# Patient Record
Sex: Female | Born: 1945 | Race: White | Hispanic: No | Marital: Married | Smoking: Never smoker
Health system: Southern US, Community
[De-identification: ages and names within clinical notes are randomized; demographics above are authoritative.]

## PROBLEM LIST (undated history)

## (undated) DIAGNOSIS — E785 Hyperlipidemia, unspecified: Secondary | ICD-10-CM

## (undated) DIAGNOSIS — I4891 Unspecified atrial fibrillation: Secondary | ICD-10-CM

## (undated) DIAGNOSIS — I10 Essential (primary) hypertension: Secondary | ICD-10-CM

## (undated) DIAGNOSIS — R42 Dizziness and giddiness: Secondary | ICD-10-CM

## (undated) HISTORY — DX: Dizziness and giddiness: R42

## (undated) HISTORY — PX: JOINT REPLACEMENT: SHX530

## (undated) HISTORY — PX: BUNIONECTOMY: SHX129

---

## 2000-06-07 ENCOUNTER — Other Ambulatory Visit: Admission: RE | Admit: 2000-06-07 | Discharge: 2000-06-07 | Payer: Self-pay | Admitting: Obstetrics and Gynecology

## 2000-06-12 ENCOUNTER — Encounter: Payer: Self-pay | Admitting: Obstetrics and Gynecology

## 2000-06-12 ENCOUNTER — Encounter: Admission: RE | Admit: 2000-06-12 | Discharge: 2000-06-12 | Payer: Self-pay | Admitting: Obstetrics and Gynecology

## 2001-06-21 ENCOUNTER — Encounter: Payer: Self-pay | Admitting: Obstetrics and Gynecology

## 2001-06-21 ENCOUNTER — Encounter: Admission: RE | Admit: 2001-06-21 | Discharge: 2001-06-21 | Payer: Self-pay | Admitting: Obstetrics and Gynecology

## 2002-07-29 ENCOUNTER — Encounter: Admission: RE | Admit: 2002-07-29 | Discharge: 2002-07-29 | Payer: Self-pay | Admitting: Obstetrics and Gynecology

## 2002-07-29 ENCOUNTER — Encounter: Payer: Self-pay | Admitting: Obstetrics and Gynecology

## 2002-08-01 ENCOUNTER — Encounter: Payer: Self-pay | Admitting: Obstetrics and Gynecology

## 2002-08-01 ENCOUNTER — Encounter: Admission: RE | Admit: 2002-08-01 | Discharge: 2002-08-01 | Payer: Self-pay | Admitting: Internal Medicine

## 2003-08-14 ENCOUNTER — Encounter: Admission: RE | Admit: 2003-08-14 | Discharge: 2003-08-14 | Payer: Self-pay | Admitting: Internal Medicine

## 2003-08-27 ENCOUNTER — Encounter: Admission: RE | Admit: 2003-08-27 | Discharge: 2003-08-27 | Payer: Self-pay | Admitting: Obstetrics and Gynecology

## 2005-08-07 ENCOUNTER — Emergency Department (HOSPITAL_COMMUNITY): Admission: EM | Admit: 2005-08-07 | Discharge: 2005-08-07 | Payer: Self-pay | Admitting: Emergency Medicine

## 2010-10-16 ENCOUNTER — Encounter: Payer: Self-pay | Admitting: Obstetrics and Gynecology

## 2011-11-13 ENCOUNTER — Emergency Department (HOSPITAL_COMMUNITY)
Admission: EM | Admit: 2011-11-13 | Discharge: 2011-11-13 | Disposition: A | Discharge status: Home / Self Care | Payer: Medicare Other | Attending: Emergency Medicine | Admitting: Emergency Medicine

## 2011-11-13 ENCOUNTER — Encounter (HOSPITAL_COMMUNITY): Payer: Self-pay | Admitting: Emergency Medicine

## 2011-11-13 DIAGNOSIS — R3915 Urgency of urination: Secondary | ICD-10-CM | POA: Insufficient documentation

## 2011-11-13 DIAGNOSIS — N390 Urinary tract infection, site not specified: Secondary | ICD-10-CM

## 2011-11-13 DIAGNOSIS — R350 Frequency of micturition: Secondary | ICD-10-CM | POA: Insufficient documentation

## 2011-11-13 DIAGNOSIS — R300 Dysuria: Secondary | ICD-10-CM | POA: Insufficient documentation

## 2011-11-13 LAB — URINALYSIS, ROUTINE W REFLEX MICROSCOPIC
Bilirubin Urine: NEGATIVE
Ketones, ur: NEGATIVE mg/dL
Protein, ur: NEGATIVE mg/dL
Urobilinogen, UA: 0.2 mg/dL (ref 0.0–1.0)

## 2011-11-13 LAB — URINE MICROSCOPIC-ADD ON

## 2011-11-13 MED ORDER — PHENAZOPYRIDINE HCL 200 MG PO TABS: 200.0000 mg | ORAL_TABLET | Freq: Three times a day (TID) | ORAL | Status: AC

## 2011-11-13 MED ORDER — CEFPODOXIME PROXETIL 200 MG PO TABS: 200.0000 mg | ORAL_TABLET | Freq: Two times a day (BID) | ORAL | Status: AC

## 2011-11-13 MED ORDER — CEFTRIAXONE SODIUM 1 G IJ SOLR
1.0000 g | Freq: Once | INTRAMUSCULAR | Status: AC
Start: 2011-11-13 — End: 2011-11-13
  Administered 2011-11-13: 1 g via INTRAMUSCULAR
  Filled 2011-11-13: qty 10

## 2011-11-13 NOTE — ED Provider Notes (Signed)
History     CSN: [id number]  Arrival date & time 11/13/11  0248   First MD Initiated Contact with Patient 11/13/11 0335      Chief Complaint  Patient presents with  . Urinary Tract Infection    (Consider location/radiation/quality/duration/timing/severity/associated sxs/prior treatment) HPI Dysuria with urgency and frequency tonight. Patient has had a urinary tract infection in the past and this feels similar. She denies any fever chills. She denies any nausea vomiting or diarrhea. No abdominal pain or discomfort. Burning only with urination. No radiation of discomfort. Moderate in severity. Intermittent symptoms. No back pain. No vaginal bleeding or discharge.   History reviewed. No pertinent past medical history.  History reviewed. No pertinent past surgical history.  History reviewed. No pertinent family history.  History  Substance Use Topics  . Smoking status: Never Smoker   . Smokeless tobacco: Not on file  . Alcohol Use: No    OB History    Grav Para Term Preterm Abortions TAB SAB Ect Mult Living                  Review of Systems  Constitutional: Negative for fever and chills.  HENT: Negative for neck pain and neck stiffness.   Eyes: Negative for pain.  Respiratory: Negative for shortness of breath.   Cardiovascular: Negative for chest pain.  Gastrointestinal: Negative for abdominal pain.  Genitourinary: Positive for dysuria, urgency and frequency.  Musculoskeletal: Negative for back pain.  Skin: Negative for rash.  Neurological: Negative for headaches.  All other systems reviewed and are negative.    Allergies  Review of patient's allergies indicates no known allergies.  Home Medications   Current Outpatient Rx  Name Route Sig Dispense Refill  . OMEGA-3 FATTY ACIDS 1000 MG PO CAPS Oral Take 1 g by mouth daily.      BP 152/75  Pulse 86  Temp(Src) 97.4 F (36.3 C) (Oral)  Resp 18  SpO2 97%  Physical Exam  Constitutional: She is oriented to  person, place, and time. She appears well-developed and well-nourished.  HENT:  Head: Normocephalic and atraumatic.  Eyes: Conjunctivae and EOM are normal. Pupils are equal, round, and reactive to light.  Neck: Trachea normal. Neck supple. No thyromegaly present.  Cardiovascular: Normal rate, regular rhythm, S1 normal, S2 normal and normal pulses.     No systolic murmur is present   No diastolic murmur is present  Pulses:      Radial pulses are 2+ on the right side, and 2+ on the left side.  Pulmonary/Chest: Effort normal and breath sounds normal. She has no wheezes. She has no rhonchi. She has no rales. She exhibits no tenderness.  Abdominal: Soft. Normal appearance and bowel sounds are normal. There is no tenderness. There is no rebound, no guarding, no CVA tenderness and negative Murphy's sign.  Musculoskeletal:       BLE:s Calves nontender, no cords or erythema, negative Homans sign  Neurological: She is alert and oriented to person, place, and time. She has normal strength. No cranial nerve deficit or sensory deficit. GCS eye subscore is 4. GCS verbal subscore is 5. GCS motor subscore is 6.  Skin: Skin is warm and dry. No rash noted. She is not diaphoretic.  Psychiatric: Her speech is normal.       Cooperative and appropriate    ED Course  Procedures (including critical care time)  Labs Reviewed  URINALYSIS, ROUTINE W REFLEX MICROSCOPIC - Abnormal; Notable for the following:    Color,  Urine STRAW (*)    Specific Gravity, Urine 1.002 (*)    Hgb urine dipstick MODERATE (*)    Leukocytes, UA LARGE (*)    All other components within normal limits  URINE MICROSCOPIC-ADD ON  URINE CULTURE    Intramuscular Rocephin. Urine culture sent   MDM   Clinical UTI verified by UA as above. Culture pending. Prescription for Vantin provided. Reliable historian verbalizes UTI precautions. No clinical pyelo. Stable for outpatient management and d/c home.         Sunnie Nielsen,  MD 11/13/11 [phone]

## 2011-11-13 NOTE — ED Notes (Signed)
Patient complaining of burning during urination and decreased urinary output that started tonight.  Patient denies past history of UTIs.

## 2011-11-14 LAB — URINE CULTURE
Colony Count: 15000
Culture  Setup Time: 201302171110

## 2012-02-26 ENCOUNTER — Encounter (HOSPITAL_COMMUNITY): Payer: Self-pay | Admitting: *Deleted

## 2012-02-26 ENCOUNTER — Emergency Department (HOSPITAL_COMMUNITY)
Admission: EM | Admit: 2012-02-26 | Discharge: 2012-02-26 | Disposition: A | Discharge status: Home / Self Care | Payer: Medicare Other | Attending: Emergency Medicine | Admitting: Emergency Medicine

## 2012-02-26 DIAGNOSIS — N390 Urinary tract infection, site not specified: Secondary | ICD-10-CM

## 2012-02-26 LAB — URINALYSIS, ROUTINE W REFLEX MICROSCOPIC
Bilirubin Urine: NEGATIVE
Specific Gravity, Urine: 1.004 — ABNORMAL LOW (ref 1.005–1.030)
Urobilinogen, UA: 0.2 mg/dL (ref 0.0–1.0)

## 2012-02-26 LAB — URINE MICROSCOPIC-ADD ON

## 2012-02-26 MED ORDER — PHENAZOPYRIDINE HCL 200 MG PO TABS: 200.0000 mg | ORAL_TABLET | Freq: Three times a day (TID) | ORAL | Status: AC

## 2012-02-26 MED ORDER — SULFAMETHOXAZOLE-TRIMETHOPRIM 800-160 MG PO TABS: 1.0000 | ORAL_TABLET | Freq: Two times a day (BID) | ORAL | Status: AC

## 2012-02-26 NOTE — Discharge Instructions (Signed)
Your tests show that you have a very slight urinary infection - take the medicines as prescribed, see your doctor in 2 days if still having symptoms.  Return to the hospital for severe or worsening pain, nausea, fevers

## 2012-02-26 NOTE — ED Notes (Signed)
Pt c/o pain with urination that started yesterday.

## 2012-02-26 NOTE — ED Provider Notes (Signed)
History     CSN: [id number]  Arrival date & time 02/26/12  0447   First MD Initiated Contact with Patient 02/26/12 [phone]      Chief Complaint  Patient presents with  . Urinary Retention    (Consider location/radiation/quality/duration/timing/severity/associated sxs/prior treatment) HPI Comments: 66 year old female with a history of no significant past medical problems other than a urinary infection 2 month ago.  She states that starting yesterday she started having some burning when she urinated, no pain in the abdomen, no flank pain, no fevers chills nausea or vomiting. The symptoms are intermittent, nothing makes this better or worse  The history is provided by the patient.    History reviewed. No pertinent past medical history.  History reviewed. No pertinent past surgical history.  No family history on file.  History  Substance Use Topics  . Smoking status: Never Smoker   . Smokeless tobacco: Not on file  . Alcohol Use: No    OB History    Grav Para Term Preterm Abortions TAB SAB Ect Mult Living                  Review of Systems  Constitutional: Negative for fever and chills.  Gastrointestinal: Negative for nausea, vomiting and abdominal pain.  Genitourinary: Positive for dysuria. Negative for frequency, hematuria, flank pain and difficulty urinating.  Musculoskeletal: Negative for back pain.    Allergies  Review of patient's allergies indicates no known allergies.  Home Medications   Current Outpatient Rx  Name Route Sig Dispense Refill  . OMEGA-3 FATTY ACIDS 1000 MG PO CAPS Oral Take 1 g by mouth daily.    Marland Kitchen VITAMIN D (CHOLECALCIFEROL) 400 UNITS PO CHEW Oral Chew 1 tablet by mouth daily.    Marland Kitchen PHENAZOPYRIDINE HCL 200 MG PO TABS Oral Take 1 tablet (200 mg total) by mouth 3 (three) times daily. 6 tablet 0  . SULFAMETHOXAZOLE-TRIMETHOPRIM 800-160 MG PO TABS Oral Take 1 tablet by mouth every 12 (twelve) hours. 14 tablet 0    BP 161/91  Pulse 74  Temp 97.9  F (36.6 C)  Resp 16  SpO2 99%  Physical Exam  Nursing note and vitals reviewed. Constitutional: She appears well-developed and well-nourished. No distress.  HENT:  Head: Normocephalic and atraumatic.  Mouth/Throat: Oropharynx is clear and moist. No oropharyngeal exudate.  Eyes: Conjunctivae are normal. No scleral icterus.  Neck: Normal range of motion. Neck supple. No thyromegaly present.  Cardiovascular: Normal rate, regular rhythm, normal heart sounds and intact distal pulses.   Pulmonary/Chest: Effort normal and breath sounds normal. No respiratory distress.  Abdominal: Soft. She exhibits no distension. There is no tenderness. There is no rebound and no guarding.  Musculoskeletal: Normal range of motion. She exhibits no edema and no tenderness.  Neurological: She is alert. Coordination normal.  Skin: Skin is warm and dry. No rash noted. No erythema.    ED Course  Procedures (including critical care time)  Labs Reviewed  URINALYSIS, ROUTINE W REFLEX MICROSCOPIC - Abnormal; Notable for the following:    Specific Gravity, Urine 1.004 (*)    Hgb urine dipstick LARGE (*)    Leukocytes, UA LARGE (*)    All other components within normal limits  URINE MICROSCOPIC-ADD ON - Abnormal; Notable for the following:    Bacteria, UA FEW (*) RARE   All other components within normal limits  URINE CULTURE   No results found.   1. UTI (urinary tract infection)       MDM  Urinalysis shows borderline infection, culture ordered, Bactrim given for prescription, benign appearance otherwise, no urinary retention.  Discharge Prescriptions include:  Bactrim        Vida Roller, MD 02/26/12 [phone]

## 2012-02-27 LAB — URINE CULTURE: Culture  Setup Time: 201306021129

## 2012-10-17 ENCOUNTER — Emergency Department (HOSPITAL_COMMUNITY)
Admission: EM | Admit: 2012-10-17 | Discharge: 2012-10-17 | Disposition: A | Discharge status: Home / Self Care | Payer: Medicare Other | Attending: Emergency Medicine | Admitting: Emergency Medicine

## 2012-10-17 ENCOUNTER — Emergency Department (HOSPITAL_COMMUNITY): Payer: Medicare Other

## 2012-10-17 ENCOUNTER — Encounter (HOSPITAL_COMMUNITY): Payer: Self-pay | Admitting: Emergency Medicine

## 2012-10-17 DIAGNOSIS — Z79899 Other long term (current) drug therapy: Secondary | ICD-10-CM | POA: Insufficient documentation

## 2012-10-17 DIAGNOSIS — I10 Essential (primary) hypertension: Secondary | ICD-10-CM | POA: Insufficient documentation

## 2012-10-17 DIAGNOSIS — IMO0001 Reserved for inherently not codable concepts without codable children: Secondary | ICD-10-CM | POA: Insufficient documentation

## 2012-10-17 DIAGNOSIS — R5381 Other malaise: Secondary | ICD-10-CM | POA: Insufficient documentation

## 2012-10-17 DIAGNOSIS — R42 Dizziness and giddiness: Secondary | ICD-10-CM | POA: Insufficient documentation

## 2012-10-17 DIAGNOSIS — R5383 Other fatigue: Secondary | ICD-10-CM | POA: Insufficient documentation

## 2012-10-17 LAB — URINALYSIS, ROUTINE W REFLEX MICROSCOPIC
Bilirubin Urine: NEGATIVE
Ketones, ur: NEGATIVE mg/dL
Nitrite: NEGATIVE
Urobilinogen, UA: 0.2 mg/dL (ref 0.0–1.0)

## 2012-10-17 LAB — CBC WITH DIFFERENTIAL/PLATELET
Basophils Absolute: 0 10*3/uL (ref 0.0–0.1)
Basophils Relative: 0 % (ref 0–1)
Eosinophils Absolute: 0.1 10*3/uL (ref 0.0–0.7)
Eosinophils Relative: 1 % (ref 0–5)
HCT: 38.4 % (ref 36.0–46.0)
MCHC: 34.1 g/dL (ref 30.0–36.0)
MCV: 85.5 fL (ref 78.0–100.0)
Monocytes Absolute: 0.3 10*3/uL (ref 0.1–1.0)
RDW: 12.7 % (ref 11.5–15.5)

## 2012-10-17 LAB — BASIC METABOLIC PANEL
Calcium: 9.5 mg/dL (ref 8.4–10.5)
Creatinine, Ser: 0.52 mg/dL (ref 0.50–1.10)
GFR calc Af Amer: >90 mL/min (ref >90)

## 2012-10-17 LAB — TROPONIN I: Troponin I: <0.3 ng/mL (ref <0.30)

## 2012-10-17 MED ORDER — HYDROCHLOROTHIAZIDE 25 MG PO TABS: 25.0000 mg | ORAL_TABLET | Freq: Every day | ORAL | Status: DC

## 2012-10-17 NOTE — ED Provider Notes (Signed)
History     CSN: [id number]  Arrival date & time 10/17/12  0806   First MD Initiated Contact with Patient 10/17/12 [phone]      Chief Complaint  Patient presents with  . Dizziness  . Fatigue    (Consider location/radiation/quality/duration/timing/severity/associated sxs/prior treatment) The history is provided by the patient.   patient states that she feels dizzy and weak. She states that she was lightheaded and started sweating. She states her blood pressure was 180s systolic. She states she's on no blood pressure medications. No chest pain. She describes as lightheadedness not as the room spinning. No headache. No confusion. She states she's been doing well the last 2 days. She states that she works 9 to 6:30 every day. She does not smoke. Does not see Dr. She has no known cardiac problems.  History reviewed. No pertinent past medical history.  History reviewed. No pertinent past surgical history.  No family history on file.  History  Substance Use Topics  . Smoking status: Never Smoker   . Smokeless tobacco: Not on file  . Alcohol Use: No    OB History    Grav Para Term Preterm Abortions TAB SAB Ect Mult Living                  Review of Systems  Constitutional: Negative for activity change and appetite change.  HENT: Negative for neck stiffness.   Eyes: Negative for pain.  Respiratory: Negative for chest tightness and shortness of breath.   Cardiovascular: Negative for chest pain and leg swelling.  Gastrointestinal: Negative for nausea, vomiting, abdominal pain and diarrhea.  Genitourinary: Negative for flank pain.  Musculoskeletal: Negative for back pain.  Skin: Negative for rash.  Neurological: Positive for light-headedness. Negative for weakness, numbness and headaches.  Psychiatric/Behavioral: Negative for behavioral problems.    Allergies  Review of patient's allergies indicates no known allergies.  Home Medications   Current Outpatient Rx  Name  Route   Sig  Dispense  Refill  . FISH OIL 1200 MG PO CAPS   Oral   Take 1,200 mg by mouth daily.         Marland Kitchen HYDROCHLOROTHIAZIDE 25 MG PO TABS   Oral   Take 1 tablet (25 mg total) by mouth daily.   30 tablet   0     BP 159/80  Pulse 80  Temp 97.8 F (36.6 C) (Oral)  Resp 18  Ht 5\' 5"  (1.651 m)  Wt 122 lb (55.339 kg)  BMI 20.30 kg/m2  SpO2 100%  Physical Exam  Nursing note and vitals reviewed. Constitutional: She is oriented to person, place, and time. She appears well-developed and well-nourished.  HENT:  Head: Normocephalic and atraumatic.  Eyes: EOM are normal. Pupils are equal, round, and reactive to light.  Neck: Normal range of motion. Neck supple.  Cardiovascular: Normal rate, regular rhythm and normal heart sounds.   No murmur heard. Pulmonary/Chest: Effort normal and breath sounds normal. No respiratory distress. She has no wheezes. She has no rales.  Abdominal: Soft. Bowel sounds are normal. She exhibits no distension. There is no tenderness. There is no rebound and no guarding.  Musculoskeletal: Normal range of motion.  Neurological: She is alert and oriented to person, place, and time. No cranial nerve deficit.  Skin: Skin is warm and dry.  Psychiatric: She has a normal mood and affect. Her speech is normal.    ED Course  Procedures (including critical care time)  Labs Reviewed  BASIC METABOLIC  PANEL - Abnormal; Notable for the following:    Glucose, Bld 118 (*)     All other components within normal limits  CBC WITH DIFFERENTIAL  TROPONIN I  URINALYSIS, ROUTINE W REFLEX MICROSCOPIC  LAB REPORT - SCANNED   Dg Chest 2 View  10/17/2012  *RADIOLOGY REPORT*  Clinical Data: Lightheadedness, dizziness, hypertensive, sweating  CHEST - 2 VIEW  Comparison: None  Findings: Normal heart size, mediastinal contours, and pulmonary vascularity. Lungs minimally hyperaerated but clear. No pleural effusion or pneumothorax. Minimal peribronchial thickening centrally. Bones  demineralized.  IMPRESSION: Minimal bronchitic changes and hyperaeration. No acute abnormalities.   Original Report Authenticated By: Ulyses Southward, M.D.    Ct Head Wo Contrast  10/17/2012  *RADIOLOGY REPORT*  Clinical Data: Dizziness, weakness, lightheadedness, diaphoresis, elevated blood pressure  CT HEAD WITHOUT CONTRAST  Technique:  Contiguous axial images were obtained from the base of the skull through the vertex without contrast.  Comparison: None  Findings: Mild age-related atrophy. Normal ventricular morphology. No midline shift or mass effect. Scattered areas of white matter hypoattenuation question small vessel chronic ischemic change. Difficult to exclude a deep white matter infarct in left frontoparietal region. No definite intracranial hemorrhage, mass lesion or additional evidence of acute infarction. No extra-axial fluid collections. Small amount of fluid within the sphenoid sinus. Bones unremarkable.  IMPRESSION: Probable small vessel chronic ischemic changes of deep cerebral white matter. Question more pronounced white matter change versus white matter infarct in deep left frontoparietal white matter.   Original Report Authenticated By: Ulyses Southward, M.D.    Mr Brain Wo Contrast  10/17/2012  *RADIOLOGY REPORT*  Clinical Data: Dizziness and fatigue.  Rule out stroke.  Abnormal CT.  MRI HEAD WITHOUT CONTRAST  Technique:  Multiplanar, multiecho pulse sequences of the brain and surrounding structures were obtained according to standard protocol without intravenous contrast.  Comparison: CT head 10/17/2012  Findings: Negative for acute infarct.  Patchy hyperintensities in the cerebral white matter bilaterally with a chronic appearance.  Left-sided hyperintensity corresponds to the CT abnormality and appears chronic.  Negative for hemorrhage or mass lesion.  Ventricle size is normal.  No midline shift.  Brainstem and cerebellum are normal.  IMPRESSION: No acute abnormality.  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   Original Report Authenticated By: Janeece Riggers, M.D.      1. Dizziness   2. Hypertension      Date: 10/17/2012  Rate: 81  Rhythm: normal sinus rhythm  QRS Axis: normal  Intervals: normal  ST/T Wave abnormalities: normal  Conduction Disutrbances:none  Narrative Interpretation:   Old EKG Reviewed: none available    MDM  Patient had some dizziness this morning. Now improved. Blood pressure was elevated. EKG is reassuring. Head CT showed some possible abnormalities. MRI was done and is reassuring. She was discharged home follow with her Dr. She was started on hydrochlorothiazide due to hypertension.        Juliet Rude. Rubin Payor, MD 10/18/12 [phone]

## 2012-10-17 NOTE — ED Notes (Signed)
Patient claims that she woke up this morning around 0630 and she felt a little dizzy and weak.   Patient claims that when she went to take a shower, she felt lightheaded and started sweating.   Patient denies history of HTN.  Per [facility], patient BP was 180's systolic upon arrival.

## 2013-09-22 ENCOUNTER — Emergency Department (HOSPITAL_COMMUNITY)
Admission: EM | Admit: 2013-09-22 | Discharge: 2013-09-22 | Disposition: A | Discharge status: Home / Self Care | Payer: Medicare Other | Attending: Emergency Medicine | Admitting: Emergency Medicine

## 2013-09-22 ENCOUNTER — Encounter (HOSPITAL_COMMUNITY): Payer: Self-pay | Admitting: Emergency Medicine

## 2013-09-22 DIAGNOSIS — Z7982 Long term (current) use of aspirin: Secondary | ICD-10-CM | POA: Insufficient documentation

## 2013-09-22 DIAGNOSIS — N390 Urinary tract infection, site not specified: Secondary | ICD-10-CM

## 2013-09-22 DIAGNOSIS — M546 Pain in thoracic spine: Secondary | ICD-10-CM | POA: Insufficient documentation

## 2013-09-22 DIAGNOSIS — Z79899 Other long term (current) drug therapy: Secondary | ICD-10-CM | POA: Insufficient documentation

## 2013-09-22 DIAGNOSIS — I10 Essential (primary) hypertension: Secondary | ICD-10-CM | POA: Insufficient documentation

## 2013-09-22 HISTORY — DX: Essential (primary) hypertension: I10

## 2013-09-22 LAB — URINALYSIS, ROUTINE W REFLEX MICROSCOPIC
Bilirubin Urine: NEGATIVE
Protein, ur: NEGATIVE mg/dL
Specific Gravity, Urine: 1.004 — ABNORMAL LOW (ref 1.005–1.030)
Urobilinogen, UA: 0.2 mg/dL (ref 0.0–1.0)

## 2013-09-22 LAB — URINE MICROSCOPIC-ADD ON

## 2013-09-22 MED ORDER — PHENAZOPYRIDINE HCL 200 MG PO TABS: 200.0000 mg | ORAL_TABLET | Freq: Three times a day (TID) | ORAL | Status: DC

## 2013-09-22 MED ORDER — IBUPROFEN 400 MG PO TABS
400.0000 mg | ORAL_TABLET | Freq: Once | ORAL | Status: AC
  Administered 2013-09-22: 400 mg via ORAL
  Filled 2013-09-22: qty 1

## 2013-09-22 MED ORDER — CEPHALEXIN 500 MG PO CAPS: 500.0000 mg | ORAL_CAPSULE | Freq: Four times a day (QID) | ORAL | Status: DC

## 2013-09-22 NOTE — ED Provider Notes (Signed)
CSN: [id number]     Arrival date & time 09/22/13  0216 History   First MD Initiated Contact with Patient 09/22/13 [phone]     Chief Complaint  Patient presents with  . Dysuria  . Urinary Tract Infection   (Consider location/radiation/quality/duration/timing/severity/associated sxs/prior Treatment) HPI Pt reports she awoke around midnight tonight with dysuria, urinary frequency.  Pain is located in urethral area and occurs only with urination.  Denies fevers, chills, myalgias, abdominal pain, N/V/D, bloody stool.    Past Medical History  Diagnosis Date  . Hypertension    History reviewed. No pertinent past surgical history. History reviewed. No pertinent family history. History  Substance Use Topics  . Smoking status: Never Smoker   . Smokeless tobacco: Not on file  . Alcohol Use: No   OB History   Grav Para Term Preterm Abortions TAB SAB Ect Mult Living                 Review of Systems  Constitutional: Negative for fever and chills.  Respiratory: Negative for cough and shortness of breath.   Cardiovascular: Negative for chest pain.  Gastrointestinal: Negative for nausea, vomiting, abdominal pain, diarrhea and blood in stool.  Genitourinary: Positive for dysuria, urgency and frequency.  Musculoskeletal: Positive for back pain (upper back pain after working hard all day, positional.  Not exertional). Negative for myalgias.  Neurological: Negative for dizziness and light-headedness.    Allergies  Review of patient's allergies indicates no known allergies.  Home Medications   Current Outpatient Rx  Name  Route  Sig  Dispense  Refill  . aspirin EC 81 MG tablet   Oral   Take 81 mg by mouth daily.         Marland Kitchen olmesartan (BENICAR) 5 MG tablet   Oral   Take 10 mg by mouth daily.          BP 144/73  Pulse 78  Temp(Src) 98.2 F (36.8 C) (Oral)  Resp 16  SpO2 100% Physical Exam  Nursing note and vitals reviewed. Constitutional: She appears well-developed and  well-nourished. No distress.  HENT:  Head: Normocephalic and atraumatic.  Neck: Neck supple.  Pulmonary/Chest: Effort normal.  Abdominal: Soft. She exhibits no distension and no mass. There is no tenderness. There is no rebound and no guarding.  Musculoskeletal:  Spine nontender, no crepitus, or stepoffs. No overlying skin changes.  Neurological: She is alert.  Skin: She is not diaphoretic.    ED Course  Procedures (including critical care time) Labs Review Labs Reviewed  URINALYSIS, ROUTINE W REFLEX MICROSCOPIC - Abnormal; Notable for the following:    Color, Urine STRAW (*)    APPearance CLOUDY (*)    Specific Gravity, Urine 1.004 (*)    Hgb urine dipstick SMALL (*)    Leukocytes, UA MODERATE (*)    All other components within normal limits  URINE MICROSCOPIC-ADD ON - Abnormal; Notable for the following:    Bacteria, UA FEW (*)    All other components within normal limits  URINE CULTURE   Imaging Review No results found.  EKG Interpretation   None      6:57 AM Dr Hyacinth Meeker made aware of the patient.   MDM   1. UTI (lower urinary tract infection)    Pt with isolated urinary symptoms without systemic symptoms or abdominal pain.  She is afebrile, nontoxic.  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  The pain is positional and she states she feels she needs a massage - there are no associated symptoms and it is not exertional.  I doubt that this is ACS.  No red flags for back pain.   D/C home with keflex, pyridium, PCP follow up.  Discussed all results with patient.  Pt given return precautions.  Pt verbalizes understanding and agrees with plan.       [place], PA-C 09/22/13 [phone]

## 2013-09-22 NOTE — ED Notes (Signed)
Patient discharged to home with family. NAD.  

## 2013-09-22 NOTE — ED Notes (Signed)
Pt states yesterday at around 12:00 she noticed painful urination and difficult to urination. Pt denies frequency or odor.

## 2013-09-22 NOTE — ED Notes (Signed)
Pt states that starting around midnight last night she started having a burning pain with urination.

## 2013-09-22 NOTE — ED Provider Notes (Signed)
Pt with dysuria since last night - on exam has soft non tender abd - no f/c/n/v.  UA confirms mild UTI  Medical screening examination/treatment/procedure(s) were conducted as a shared visit with non-physician practitioner(s) and myself.  I personally evaluated the patient during the encounter.    Vida Roller, MD 09/22/13 [phone]

## 2013-09-23 LAB — URINE CULTURE: Colony Count: 4000

## 2017-06-08 ENCOUNTER — Other Ambulatory Visit: Payer: Self-pay | Admitting: Obstetrics and Gynecology

## 2017-06-08 DIAGNOSIS — R928 Other abnormal and inconclusive findings on diagnostic imaging of breast: Secondary | ICD-10-CM

## 2017-06-13 ENCOUNTER — Ambulatory Visit
Admission: RE | Admit: 2017-06-13 | Discharge: 2017-06-13 | Disposition: A | Discharge status: Home / Self Care | Payer: PRIVATE HEALTH INSURANCE | Source: Ambulatory Visit | Attending: Obstetrics and Gynecology | Admitting: Obstetrics and Gynecology

## 2017-06-13 ENCOUNTER — Ambulatory Visit
Admission: RE | Admit: 2017-06-13 | Discharge: 2017-06-13 | Disposition: A | Discharge status: Home / Self Care | Payer: Medicare Other | Source: Ambulatory Visit | Attending: Obstetrics and Gynecology | Admitting: Obstetrics and Gynecology

## 2017-06-13 DIAGNOSIS — R928 Other abnormal and inconclusive findings on diagnostic imaging of breast: Secondary | ICD-10-CM

## 2017-11-12 ENCOUNTER — Emergency Department (HOSPITAL_COMMUNITY)
Admission: EM | Admit: 2017-11-12 | Discharge: 2017-11-12 | Disposition: A | Discharge status: Home / Self Care | Payer: Medicare Other | Attending: Emergency Medicine | Admitting: Emergency Medicine

## 2017-11-12 ENCOUNTER — Encounter (HOSPITAL_COMMUNITY): Payer: Self-pay | Admitting: Emergency Medicine

## 2017-11-12 ENCOUNTER — Other Ambulatory Visit: Payer: Self-pay

## 2017-11-12 DIAGNOSIS — Z7982 Long term (current) use of aspirin: Secondary | ICD-10-CM | POA: Insufficient documentation

## 2017-11-12 DIAGNOSIS — I10 Essential (primary) hypertension: Secondary | ICD-10-CM | POA: Insufficient documentation

## 2017-11-12 DIAGNOSIS — Z79899 Other long term (current) drug therapy: Secondary | ICD-10-CM | POA: Diagnosis not present

## 2017-11-12 DIAGNOSIS — R300 Dysuria: Secondary | ICD-10-CM | POA: Diagnosis present

## 2017-11-12 DIAGNOSIS — N390 Urinary tract infection, site not specified: Secondary | ICD-10-CM | POA: Insufficient documentation

## 2017-11-12 LAB — URINALYSIS, ROUTINE W REFLEX MICROSCOPIC
Bacteria, UA: NONE SEEN
Bilirubin Urine: NEGATIVE
Glucose, UA: NEGATIVE mg/dL
Ketones, ur: NEGATIVE mg/dL
NITRITE: NEGATIVE
Protein, ur: NEGATIVE mg/dL
SPECIFIC GRAVITY, URINE: 1.003 — AB (ref 1.005–1.030)
pH: 8 (ref 5.0–8.0)

## 2017-11-12 MED ORDER — CEPHALEXIN 500 MG PO CAPS: 500.0000 mg | ORAL_CAPSULE | Freq: Two times a day (BID) | ORAL | 0 refills | Status: DC

## 2017-11-12 MED ORDER — CEPHALEXIN 250 MG PO CAPS
500.0000 mg | ORAL_CAPSULE | Freq: Once | ORAL | Status: AC
  Administered 2017-11-12: 500 mg via ORAL
  Filled 2017-11-12: qty 2

## 2017-11-12 MED ORDER — PHENAZOPYRIDINE HCL 200 MG PO TABS: 200.0000 mg | ORAL_TABLET | Freq: Three times a day (TID) | ORAL | 0 refills | Status: DC

## 2017-11-12 NOTE — Discharge Instructions (Signed)
Take Keflex as prescribed until finished. You may use Pyridium for pain as needed. Follow up with Dr. Terrence Dupont within the week for recheck of your urine and of your blood pressure.

## 2017-11-12 NOTE — ED Triage Notes (Signed)
Pt c/o increased urinary frequency with burning onset today. General malaise. Pt denies fever, denies n/v/d. Denies hematuria.  Pt returned from [place] last week.

## 2017-11-12 NOTE — ED Provider Notes (Signed)
[facility] EMERGENCY DEPARTMENT Provider Note   CSN: [id number] Arrival date & time: 11/12/17  9622     History   Chief Complaint Chief Complaint  Patient presents with  . Urinary Tract Infection    HPI Melissa Brennan is a 72 y.o. female.  72 year old female presents to the emergency department for evaluation of dysuria.  She reports preceding decreased urinary stream x2 earlier this morning.  This was followed by burning dysuria.  She states that her symptoms have persisted.  She has not taken any medications prior to arrival for her discomfort.  She does have a history of urinary tract infection many years ago and states that her symptoms today feel similar.  She has not had any associated fever, nausea, vomiting, abdominal pain, back pain.  No hematuria.  PCP is Dr. Terrence Dupont      Past Medical History:  Diagnosis Date  . Hypertension     There are no active problems to display for this patient.   History reviewed. No pertinent surgical history.  OB History    No data available       Home Medications    Prior to Admission medications   Medication Sig Start Date End Date Taking? Authorizing Provider  aspirin EC 81 MG tablet Take 81 mg by mouth daily.    [provider]  cephALEXin (KEFLEX) 500 MG capsule Take 1 capsule (500 mg total) by mouth 2 (two) times daily. 11/12/17   Antonietta Breach, PA-C  olmesartan (BENICAR) 5 MG tablet Take 10 mg by mouth daily.    [provider]  phenazopyridine (PYRIDIUM) 200 MG tablet Take 1 tablet (200 mg total) by mouth 3 (three) times daily. 11/12/17   Antonietta Breach, PA-C    Family History No family history on file.  Social History Social History   Tobacco Use  . Smoking status: Never Smoker  . Smokeless tobacco: Never Used  Substance Use Topics  . Alcohol use: No  . Drug use: No     Allergies   Patient has no known allergies.   Review of Systems Review of Systems Ten systems reviewed  and are negative for acute change, except as noted in the HPI.    Physical Exam Updated Vital Signs BP (!) 155/89   Pulse 82   Temp 98.5 F (36.9 C) (Oral)   Resp 16   Ht 5\' 5"  (1.651 m)   Wt 53.1 kg (117 lb)   SpO2 100%   BMI 19.47 kg/m   Physical Exam  Constitutional: She is oriented to person, place, and time. She appears well-developed and well-nourished. No distress.  Nontoxic appearing and in NAD  HENT:  Head: Normocephalic and atraumatic.  Eyes: Conjunctivae and EOM are normal. No scleral icterus.  Neck: Normal range of motion.  Cardiovascular: Normal rate, regular rhythm and intact distal pulses.  Pulmonary/Chest: Effort normal. No stridor. No respiratory distress. She has no wheezes.  Lungs CTAB  Abdominal: Soft. She exhibits no distension. There is no tenderness.  Soft, nontender, nondistended abdomen  Musculoskeletal: Normal range of motion.  Neurological: She is alert and oriented to person, place, and time. She exhibits normal muscle tone. Coordination normal.  Skin: Skin is warm and dry. No rash noted. She is not diaphoretic. No erythema. No pallor.  Psychiatric: She has a normal mood and affect. Her behavior is normal.  Nursing note and vitals reviewed.    ED Treatments / Results  Labs (all labs ordered are listed, but only  abnormal results are displayed) Labs Reviewed  URINALYSIS, ROUTINE W REFLEX MICROSCOPIC - Abnormal; Notable for the following components:      Result Value   Color, Urine COLORLESS (*)    Specific Gravity, Urine 1.003 (*)    Hgb urine dipstick MODERATE (*)    Leukocytes, UA MODERATE (*)    Squamous Epithelial / LPF 0-5 (*)    All other components within normal limits  URINE CULTURE    EKG  EKG Interpretation None       Radiology No results found.  Procedures Procedures (including critical care time)  Medications Ordered in ED Medications  cephALEXin (KEFLEX) capsule 500 mg (500 mg Oral Given 11/12/17 2302)      Initial Impression / Assessment and Plan / ED Course  I have reviewed the triage vital signs and the nursing notes.  Pertinent labs & imaging results that were available during my care of the patient were reviewed by me and considered in my medical decision making (see chart for details).     Pt has been diagnosed with a UTI. Pt is afebrile, no CVA tenderness, normotensive, and denies N/V. Pt to be discharged home with antibiotics and instructions to follow up with PCP if symptoms persist. Return precautions discussed and provided. Patient discharged in stable condition with no unaddressed concerns.   Final Clinical Impressions(s) / ED Diagnoses   Final diagnoses:  Acute UTI    ED Discharge Orders        Ordered    phenazopyridine (PYRIDIUM) 200 MG tablet  3 times daily     11/12/17 2256    cephALEXin (KEFLEX) 500 MG capsule  2 times daily     11/12/17 2256       Antonietta Breach, PA-C 11/12/17 2337    Merryl Hacker, MD 11/13/17 [phone]

## 2017-11-12 NOTE — ED Notes (Signed)
Main lab contacted regarding urine result, tech states urine is being run now, results should be expected in @ 59min

## 2017-11-13 LAB — URINE CULTURE: Culture: NO GROWTH

## 2017-11-27 ENCOUNTER — Emergency Department (HOSPITAL_COMMUNITY)
Admission: EM | Admit: 2017-11-27 | Discharge: 2017-11-27 | Disposition: A | Discharge status: Home / Self Care | Payer: Medicare Other | Attending: Emergency Medicine | Admitting: Emergency Medicine

## 2017-11-27 ENCOUNTER — Other Ambulatory Visit: Payer: Self-pay

## 2017-11-27 ENCOUNTER — Encounter (HOSPITAL_COMMUNITY): Payer: Self-pay

## 2017-11-27 DIAGNOSIS — I4891 Unspecified atrial fibrillation: Secondary | ICD-10-CM | POA: Insufficient documentation

## 2017-11-27 DIAGNOSIS — R42 Dizziness and giddiness: Secondary | ICD-10-CM | POA: Insufficient documentation

## 2017-11-27 DIAGNOSIS — I10 Essential (primary) hypertension: Secondary | ICD-10-CM | POA: Diagnosis not present

## 2017-11-27 DIAGNOSIS — Z7982 Long term (current) use of aspirin: Secondary | ICD-10-CM | POA: Insufficient documentation

## 2017-11-27 DIAGNOSIS — R1110 Vomiting, unspecified: Secondary | ICD-10-CM | POA: Diagnosis present

## 2017-11-27 DIAGNOSIS — Z79899 Other long term (current) drug therapy: Secondary | ICD-10-CM | POA: Diagnosis not present

## 2017-11-27 LAB — CBC
HCT: 42.4 % (ref 36.0–46.0)
Hemoglobin: 13.4 g/dL (ref 12.0–15.0)
MCH: 28 pg (ref 26.0–34.0)
MCHC: 31.6 g/dL (ref 30.0–36.0)
MCV: 88.5 fL (ref 78.0–100.0)
Platelets: 197 10*3/uL (ref 150–400)
RBC: 4.79 MIL/uL (ref 3.87–5.11)
RDW: 13.2 % (ref 11.5–15.5)
WBC: 10.8 10*3/uL — ABNORMAL HIGH (ref 4.0–10.5)

## 2017-11-27 LAB — COMPREHENSIVE METABOLIC PANEL
ALT: 24 U/L (ref 14–54)
AST: 27 U/L (ref 15–41)
Albumin: 4.1 g/dL (ref 3.5–5.0)
Alkaline Phosphatase: 91 U/L (ref 38–126)
Anion gap: 11 (ref 5–15)
BUN: 10 mg/dL (ref 6–20)
CO2: 23 mmol/L (ref 22–32)
Calcium: 9.4 mg/dL (ref 8.9–10.3)
Chloride: 107 mmol/L (ref 101–111)
Creatinine, Ser: 0.69 mg/dL (ref 0.44–1.00)
GFR calc Af Amer: >60 mL/min (ref >60)
GFR calc non Af Amer: >60 mL/min (ref >60)
Glucose, Bld: 129 mg/dL — ABNORMAL HIGH (ref 65–99)
Potassium: 3.3 mmol/L — ABNORMAL LOW (ref 3.5–5.1)
Sodium: 141 mmol/L (ref 135–145)
Total Bilirubin: 0.9 mg/dL (ref 0.3–1.2)
Total Protein: 8.2 g/dL — ABNORMAL HIGH (ref 6.5–8.1)

## 2017-11-27 LAB — MAGNESIUM: Magnesium: 2 mg/dL (ref 1.7–2.4)

## 2017-11-27 LAB — LIPASE, BLOOD: Lipase: 52 U/L — ABNORMAL HIGH (ref 11–51)

## 2017-11-27 MED ORDER — POTASSIUM CHLORIDE CRYS ER 20 MEQ PO TBCR
40.0000 meq | EXTENDED_RELEASE_TABLET | Freq: Once | ORAL | Status: AC
  Administered 2017-11-27: 40 meq via ORAL
  Filled 2017-11-27: qty 2

## 2017-11-27 MED ORDER — AMIODARONE HCL 200 MG PO TABS: 400.0000 mg | ORAL_TABLET | Freq: Two times a day (BID) | ORAL | 0 refills | Status: DC

## 2017-11-27 MED ORDER — METOPROLOL TARTRATE 5 MG/5ML IV SOLN
5.0000 mg | Freq: Once | INTRAVENOUS | Status: AC
  Administered 2017-11-27: 5 mg via INTRAVENOUS
  Filled 2017-11-27: qty 5

## 2017-11-27 MED ORDER — AMIODARONE HCL 200 MG PO TABS
400.0000 mg | ORAL_TABLET | Freq: Once | ORAL | Status: AC
  Administered 2017-11-27: 400 mg via ORAL
  Filled 2017-11-27: qty 2

## 2017-11-27 MED ORDER — APIXABAN 5 MG PO TABS: 5.0000 mg | ORAL_TABLET | Freq: Two times a day (BID) | ORAL | 0 refills | Status: DC

## 2017-11-27 MED ORDER — MECLIZINE HCL 25 MG PO TABS
25.0000 mg | ORAL_TABLET | Freq: Once | ORAL | Status: AC
  Administered 2017-11-27: 25 mg via ORAL
  Filled 2017-11-27: qty 1

## 2017-11-27 NOTE — ED Triage Notes (Signed)
Pt. From home via EMS with reports of n/v since 0400. Pt. Also reports dizziness and SHOB. Pt. Currently dines SHOB. Pt. afib on monitor with no prior history. Rate from 90-128.  CBG 199  Pt. Denies pain.  NAD

## 2017-11-27 NOTE — ED Notes (Signed)
Pt now endorses dizziness returning, edp notified.

## 2017-11-27 NOTE — ED Notes (Addendum)
This RN went to assess patient for discharge, pt reports feeling better, when pt stood up heart rate went up to 140. EDP notified.pt reports no longer feeling dizzy

## 2017-11-27 NOTE — ED Provider Notes (Signed)
[facility] EMERGENCY DEPARTMENT Provider Note   CSN: [id number] Arrival date & time: 11/27/17  3220     History   Chief Complaint No chief complaint on file.   HPI Melissa Brennan is a 72 y.o. female.  HPI   72yF with dizziness. Woke up around 0400. Felt like room was spinning and very sick to stomach. Vomited. Felt of balance. Currently feeling better but not quite back to baseline. Denies pain. Did feel a little SOB. Noted to be in afib. Nor prior history. Denies palpitations currently but says she has had them intermittently for at least months.   Past Medical History:  Diagnosis Date  . Hypertension     There are no active problems to display for this patient.   History reviewed. No pertinent surgical history.  OB History    No data available       Home Medications    Prior to Admission medications   Medication Sig Start Date End Date Taking? Authorizing Provider  aspirin EC 81 MG tablet Take 81 mg by mouth daily.    [provider]  cephALEXin (KEFLEX) 500 MG capsule Take 1 capsule (500 mg total) by mouth 2 (two) times daily. 11/12/17   Antonietta Breach, PA-C  olmesartan (BENICAR) 5 MG tablet Take 10 mg by mouth daily.    [provider]  phenazopyridine (PYRIDIUM) 200 MG tablet Take 1 tablet (200 mg total) by mouth 3 (three) times daily. 11/12/17   Antonietta Breach, PA-C    Family History History reviewed. No pertinent family history.  Social History Social History   Tobacco Use  . Smoking status: Never Smoker  . Smokeless tobacco: Never Used  Substance Use Topics  . Alcohol use: No  . Drug use: No     Allergies   Patient has no known allergies.   Review of Systems Review of Systems  All systems reviewed and negative, other than as noted in HPI.  Physical Exam Updated Vital Signs BP (!) 144/85   Pulse (!) 117   Temp (!) 97.4 F (36.3 C) (Oral)   Resp 17   Ht 5\' 5"  (1.651 m)   Wt 63.5 kg (140 lb)   SpO2 100%    BMI 23.30 kg/m   Physical Exam  Constitutional: She appears well-developed and well-nourished. No distress.  HENT:  Head: Normocephalic and atraumatic.  Eyes: Conjunctivae are normal. Right eye exhibits no discharge. Left eye exhibits no discharge.  Neck: Neck supple.  Cardiovascular: Normal heart sounds. Exam reveals no gallop and no friction rub.  No murmur heard. Irregularly irregular  Pulmonary/Chest: Effort normal and breath sounds normal. No respiratory distress.  Abdominal: Soft. She exhibits no distension. There is no tenderness.  Musculoskeletal: She exhibits no edema or tenderness.  Lower extremities symmetric as compared to each other. No calf tenderness. Negative Homan's. No palpable cords.   Neurological: She is alert.  Skin: Skin is warm and dry.  Psychiatric: She has a normal mood and affect. Her behavior is normal. Thought content normal.  Nursing note and vitals reviewed.    ED Treatments / Results  Labs (all labs ordered are listed, but only abnormal results are displayed) Labs Reviewed  LIPASE, BLOOD - Abnormal; Notable for the following components:      Result Value   Lipase 52 (*)    All other components within normal limits  COMPREHENSIVE METABOLIC PANEL - Abnormal; Notable for the following components:   Potassium 3.3 (*)    Glucose,  Bld 129 (*)    Total Protein 8.2 (*)    All other components within normal limits  CBC - Abnormal; Notable for the following components:   WBC 10.8 (*)    All other components within normal limits  MAGNESIUM  URINALYSIS, ROUTINE W REFLEX MICROSCOPIC    EKG  EKG Interpretation  Date/Time:  Monday November 27 2017 06:42:50 EST Ventricular Rate:  109 PR Interval:    QRS Duration: 101 QT Interval:  342 QTC Calculation: 461 R Axis:   54 Text Interpretation:  Atrial fibrillation Borderline repol abnormality, diffuse leads Confirmed by Virgel Manifold [phone]) on 11/27/2017 7:11:34 AM       Radiology No results  found.  Procedures Procedures (including critical care time)  Medications Ordered in ED Medications  metoprolol tartrate (LOPRESSOR) injection 5 mg (not administered)  potassium chloride SA (K-DUR,KLOR-CON) CR tablet 40 mEq (not administered)  meclizine (ANTIVERT) tablet 25 mg (not administered)     Initial Impression / Assessment and Plan / ED Course  I have reviewed the triage vital signs and the nursing notes.  Pertinent labs & imaging results that were available during my care of the patient were reviewed by me and considered in my medical decision making (see chart for details).     72yF with new onset afib. Presented with "dizziness" but I suspect this is vertigo. New diagnosis of afib but I suspect onset wasn't actually this morning though. She reports intermittent palpitations for at least months and actually denies symptoms currently despite HR into 120s when I was talking with her. CHADSVASC of 3.Has established cardiologist, Dr Terrence Dupont. He would like her on amiodarone and eliquis. He will see her in the office on Wednesday.  Pt advised she can get meclizine otc and take as needed for vertigo.   Final Clinical Impressions(s) / ED Diagnoses   Final diagnoses:  Vertigo  New onset atrial fibrillation [facility])    ED Discharge Orders    None       Virgel Manifold, MD 11/27/17 1009

## 2017-11-27 NOTE — Discharge Instructions (Signed)
You are being started on two medication per recommendation of Dr Terrence Dupont. Take them as prescribed. He would like ot see you in his office this Wednesday.

## 2017-11-29 ENCOUNTER — Emergency Department (HOSPITAL_COMMUNITY): Payer: Medicare Other

## 2017-11-29 ENCOUNTER — Emergency Department (HOSPITAL_COMMUNITY)
Admission: EM | Admit: 2017-11-29 | Discharge: 2017-11-29 | Disposition: A | Discharge status: Home / Self Care | Payer: Medicare Other | Attending: Emergency Medicine | Admitting: Emergency Medicine

## 2017-11-29 ENCOUNTER — Other Ambulatory Visit: Payer: Self-pay

## 2017-11-29 ENCOUNTER — Encounter (HOSPITAL_COMMUNITY): Payer: Self-pay | Admitting: Emergency Medicine

## 2017-11-29 DIAGNOSIS — Z7982 Long term (current) use of aspirin: Secondary | ICD-10-CM | POA: Diagnosis not present

## 2017-11-29 DIAGNOSIS — I10 Essential (primary) hypertension: Secondary | ICD-10-CM | POA: Diagnosis not present

## 2017-11-29 DIAGNOSIS — I4891 Unspecified atrial fibrillation: Secondary | ICD-10-CM | POA: Diagnosis not present

## 2017-11-29 DIAGNOSIS — R42 Dizziness and giddiness: Secondary | ICD-10-CM | POA: Diagnosis not present

## 2017-11-29 DIAGNOSIS — Z7901 Long term (current) use of anticoagulants: Secondary | ICD-10-CM | POA: Insufficient documentation

## 2017-11-29 DIAGNOSIS — R112 Nausea with vomiting, unspecified: Secondary | ICD-10-CM | POA: Diagnosis present

## 2017-11-29 DIAGNOSIS — Z79899 Other long term (current) drug therapy: Secondary | ICD-10-CM | POA: Insufficient documentation

## 2017-11-29 HISTORY — DX: Unspecified atrial fibrillation: I48.91

## 2017-11-29 LAB — URINALYSIS, ROUTINE W REFLEX MICROSCOPIC
Bacteria, UA: NONE SEEN
Bilirubin Urine: NEGATIVE
Glucose, UA: NEGATIVE mg/dL
Ketones, ur: NEGATIVE mg/dL
Leukocytes, UA: NEGATIVE
Nitrite: NEGATIVE
Protein, ur: NEGATIVE mg/dL
Specific Gravity, Urine: 1.005 (ref 1.005–1.030)
Squamous Epithelial / HPF: NONE SEEN
pH: 7 (ref 5.0–8.0)

## 2017-11-29 LAB — CBC WITH DIFFERENTIAL/PLATELET
Basophils Absolute: 0 K/uL (ref 0.0–0.1)
Basophils Relative: 0 %
Eosinophils Absolute: 0 K/uL (ref 0.0–0.7)
Eosinophils Relative: 0 %
HCT: 40.4 % (ref 36.0–46.0)
Hemoglobin: 12.9 g/dL (ref 12.0–15.0)
Lymphocytes Relative: 21 %
Lymphs Abs: 2.7 K/uL (ref 0.7–4.0)
MCH: 27.9 pg (ref 26.0–34.0)
MCHC: 31.9 g/dL (ref 30.0–36.0)
MCV: 87.3 fL (ref 78.0–100.0)
Monocytes Absolute: 0.4 K/uL (ref 0.1–1.0)
Monocytes Relative: 3 %
Neutro Abs: 9.9 K/uL — ABNORMAL HIGH (ref 1.7–7.7)
Neutrophils Relative %: 76 %
Platelets: 228 K/uL (ref 150–400)
RBC: 4.63 MIL/uL (ref 3.87–5.11)
RDW: 13.7 % (ref 11.5–15.5)
WBC: 13 K/uL — ABNORMAL HIGH (ref 4.0–10.5)

## 2017-11-29 LAB — BASIC METABOLIC PANEL WITH GFR
Anion gap: 10 (ref 5–15)
BUN: 12 mg/dL (ref 6–20)
CO2: 22 mmol/L (ref 22–32)
Calcium: 9 mg/dL (ref 8.9–10.3)
Chloride: 107 mmol/L (ref 101–111)
Creatinine, Ser: 0.63 mg/dL (ref 0.44–1.00)
GFR calc Af Amer: >60 mL/min
GFR calc non Af Amer: >60 mL/min
Glucose, Bld: 105 mg/dL — ABNORMAL HIGH (ref 65–99)
Potassium: 6 mmol/L — ABNORMAL HIGH (ref 3.5–5.1)
Sodium: 139 mmol/L (ref 135–145)

## 2017-11-29 LAB — I-STAT TROPONIN, ED: Troponin i, poc: 0 ng/mL (ref 0.00–0.08)

## 2017-11-29 MED ORDER — MECLIZINE HCL 25 MG PO TABS: 25.0000 mg | ORAL_TABLET | Freq: Three times a day (TID) | ORAL | 0 refills | Status: DC | PRN

## 2017-11-29 MED ORDER — GADOBENATE DIMEGLUMINE 529 MG/ML IV SOLN: 15.0000 mL | Freq: Once | INTRAVENOUS | Status: DC | PRN

## 2017-11-29 MED ORDER — MECLIZINE HCL 25 MG PO TABS
25.0000 mg | ORAL_TABLET | Freq: Once | ORAL | Status: AC
  Administered 2017-11-29: 25 mg via ORAL
  Filled 2017-11-29: qty 1

## 2017-11-29 NOTE — ED Notes (Signed)
Patient able to ambulate independently  

## 2017-11-29 NOTE — ED Provider Notes (Signed)
Medical screening examination/treatment/procedure(s) were conducted as a shared visit with non-physician practitioner(s) and myself.  I personally evaluated the patient during the encounter. Briefly, the patient is a 72 y.o. female recently diagnosed with A. fib and placed on amiodarone and Eliquis who presents to the emergency department with sudden onset vertiginous symptoms.  On exam we found no focal deficits however unable to perform a complete HINTS exam due to patient being too symptomatic.  Workup was reassuring without significant electrolyte derangements.  MRI obtained to rule out posterior stroke given her recent diagnosis of atrial fibrillation who is not completely anticoagulated.  MRI with his negative for stroke or arterial occlusion.  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  Patient treated symptomatically with meclizine, resulting in significant improvement in her vertiginous symptoms.  She was able to ambulate without complication.  Felt to be safe for discharge with strict return precautions.   EKG Interpretation  Date/Time:  Wednesday November 29 2017 12:58:48 EST Ventricular Rate:  72 PR Interval:  172 QRS Duration: 86 QT Interval:  434 QTC Calculation: 475 R Axis:   17 Text Interpretation:  Normal sinus rhythm Low voltage QRS Cannot rule out Anterior infarct , age undetermined Abnormal ECG Otherwise no significant change Confirmed by Addison Lank [phone]) on 11/29/2017 2:08:19 PM           Fatima Blank, MD 11/30/17 1055

## 2017-11-29 NOTE — Discharge Instructions (Signed)
Take meclizine up to 3 times a day as needed for dizziness. Follow-up with a neurosurgeon for infundibulum seen on MRI of your neck. Return to the ED if you experience severe worsening of her symptoms, loss of consciousness, chest pain, difficulty breathing, change in her vision, severe weakness.

## 2017-11-29 NOTE — ED Notes (Signed)
Patient aware of need for urine sample ?

## 2017-11-29 NOTE — ED Notes (Signed)
[place] MRI and updated patient on delays

## 2017-11-29 NOTE — ED Notes (Signed)
Patient transported to MRI 

## 2017-11-29 NOTE — ED Triage Notes (Signed)
To ED via [facility] from Dr. Zenia Resides office with c/o nausea/vomiting/dizziness that has continued since M onday-- ween here  MOnday for new onset Afib-- pt is alert/oriented x 4 on arrival-- pale, w/d,  Received Zofran 4mg  IV per EMS

## 2017-11-29 NOTE — ED Provider Notes (Signed)
[facility] EMERGENCY DEPARTMENT Provider Note   CSN: [id number] Arrival date & time: 11/29/17  1058     History   Chief Complaint Chief Complaint  Patient presents with  . Nausea  . Emesis  . Dizziness    HPI Melissa Brennan is a 72 y.o. female with a past medical history of recent diagnosis A. fib, HTN who presented to the ED today from her cardiologist's office complaining of dizziness, nausea, vomiting. Patient states that 2 days ago she woke up and attempted to walk up the stairs when she felt sudden onset dizziness, "as if the room was spinning around me". She had associated nausea vomiting at that time. No reported chest pain, shortness of breath, focal weakness, paresthesias. She was seen in the ED at that time and found to be in new onset atrial fibrillation with RVR. Dr.Harwani, her cardiologist, was consult. And patient was started on amiodarone and Eliquis. She took these medications yesterday and felt okay. Today however she had another episode of dizziness, nausea and vomiting. She thinks she may have actually passed out this morning as well. She was seen in Dr.Harwani's office this a.m. and then sent to the ED for further evaluation. No prior history of similar symptoms. She denies any vision loss. She was unable to take her amiodarone Eliquis today due to her ongoing nausea and vomiting.  HPI  Past Medical History:  Diagnosis Date  . A-fib ([place])   . Hypertension     There are no active problems to display for this patient.   History reviewed. No pertinent surgical history.  OB History    No data available       Home Medications    Prior to Admission medications   Medication Sig Start Date End Date Taking? Authorizing Provider  amiodarone (PACERONE) 200 MG tablet Take 2 tablets (400 mg total) by mouth 2 (two) times daily. 11/27/17   Virgel Manifold, MD  apixaban (ELIQUIS) 5 MG TABS tablet Take 1 tablet (5 mg total) by mouth 2 (two) times daily.  11/27/17   Virgel Manifold, MD  aspirin EC 81 MG tablet Take 81 mg by mouth daily.    [provider]  cephALEXin (KEFLEX) 500 MG capsule Take 1 capsule (500 mg total) by mouth 2 (two) times daily. 11/12/17   Antonietta Breach, PA-C  olmesartan (BENICAR) 5 MG tablet Take 10 mg by mouth daily.    [provider]  phenazopyridine (PYRIDIUM) 200 MG tablet Take 1 tablet (200 mg total) by mouth 3 (three) times daily. 11/12/17   Antonietta Breach, PA-C    Family History No family history on file.  Social History Social History   Tobacco Use  . Smoking status: Never Smoker  . Smokeless tobacco: Never Used  Substance Use Topics  . Alcohol use: No  . Drug use: No     Allergies   Patient has no known allergies.   Review of Systems Review of Systems  All other systems reviewed and are negative.    Physical Exam Updated Vital Signs Ht 5\' 5"  (1.651 m)   Wt 63.5 kg (140 lb)   BMI 23.30 kg/m   Physical Exam  Constitutional: She is oriented to person, place, and time. She appears well-developed and well-nourished. No distress.  HENT:  Head: Normocephalic and atraumatic.  Mouth/Throat: No oropharyngeal exudate.  Eyes: Conjunctivae and EOM are normal. Pupils are equal, round, and reactive to light. Right eye exhibits no discharge. Left eye exhibits no discharge.  No scleral icterus.  Cardiovascular: Normal rate, regular rhythm, normal heart sounds and intact distal pulses. Exam reveals no gallop and no friction rub.  No murmur heard. Pulmonary/Chest: Effort normal and breath sounds normal. No respiratory distress. She has no wheezes. She has no rales. She exhibits no tenderness.  Abdominal: Soft. She exhibits no distension. There is no tenderness. There is no guarding.  Musculoskeletal: Normal range of motion. She exhibits no edema.  Neurological: She is alert and oriented to person, place, and time. No cranial nerve deficit.  No nystagmus. No skew. No cerebellar dysfunction,  normal finger to nose. No gait abnormality.  Skin: Skin is warm and dry. No rash noted. She is not diaphoretic. No erythema. No pallor.  Psychiatric: She has a normal mood and affect. Her behavior is normal.  Nursing note and vitals reviewed.    ED Treatments / Results  Labs (all labs ordered are listed, but only abnormal results are displayed) Labs Reviewed  CBC WITH DIFFERENTIAL/PLATELET  BASIC METABOLIC PANEL  URINALYSIS, ROUTINE W REFLEX MICROSCOPIC  I-STAT TROPONIN, ED    EKG  EKG Interpretation None       Radiology No results found.  Procedures Procedures (including critical care time)  Medications Ordered in ED Medications  meclizine (ANTIVERT) tablet 25 mg (25 mg Oral Given 11/29/17 1223)     Initial Impression / Assessment and Plan / ED Course  I have reviewed the triage vital signs and the nursing notes.  Pertinent labs & imaging results that were available during my care of the patient were reviewed by me and considered in my medical decision making (see chart for details).     72 year old female with a past medical history of new onset A. fib diagnosed 2 days ago who presented to the ED today complaining of dizziness. This is her second episode of this. No prior history of similar symptoms. Pt feels like the room is spinning around her. HINTS exam mad edificult due to pt very symptomatic at this time. Will obtain MRI brain to r/o posterior stroke as well as MRA neck to ensure no arterial dissection especially in the setting of new onset A. Fib that is incompletely anticoagulated.   MRI brain unremarkable. MRA neck shows 1-13mm infundibulum. Pt was instructed to have surveillance imaging each year and to follow up with neurosurgery. Pt was given meclizine with good symptomatic relief. Suspect BPPV as etiology of dizziness. She was able to ambulate without difficulty. Discharged to home in stable condition. Patient was discussed with and seen by Dr. Leonette Monarch who  agrees with the treatment plan. Return precautions outlined in patient discharge instructions.     Final Clinical Impressions(s) / ED Diagnoses   Final diagnoses:  Dizziness  Vertigo    ED Discharge Orders    None       Carlos Levering, PA-C 11/29/17 1738

## 2018-06-20 ENCOUNTER — Other Ambulatory Visit: Payer: Self-pay | Admitting: Obstetrics and Gynecology

## 2018-06-20 DIAGNOSIS — Z1231 Encounter for screening mammogram for malignant neoplasm of breast: Secondary | ICD-10-CM

## 2018-07-24 ENCOUNTER — Ambulatory Visit
Admission: RE | Admit: 2018-07-24 | Discharge: 2018-07-24 | Disposition: A | Discharge status: Home / Self Care | Payer: Medicare Other | Source: Ambulatory Visit | Attending: Obstetrics and Gynecology | Admitting: Obstetrics and Gynecology

## 2018-07-24 DIAGNOSIS — Z1231 Encounter for screening mammogram for malignant neoplasm of breast: Secondary | ICD-10-CM

## 2018-07-30 ENCOUNTER — Other Ambulatory Visit: Payer: Self-pay | Admitting: Chiropractic Medicine

## 2018-07-30 ENCOUNTER — Ambulatory Visit
Admission: RE | Admit: 2018-07-30 | Discharge: 2018-07-30 | Disposition: A | Discharge status: Home / Self Care | Payer: Medicare Other | Source: Ambulatory Visit | Attending: Chiropractic Medicine | Admitting: Chiropractic Medicine

## 2018-07-30 DIAGNOSIS — M5416 Radiculopathy, lumbar region: Secondary | ICD-10-CM

## 2018-07-30 DIAGNOSIS — M79604 Pain in right leg: Secondary | ICD-10-CM

## 2019-02-08 ENCOUNTER — Other Ambulatory Visit: Payer: Self-pay

## 2019-02-08 ENCOUNTER — Encounter: Payer: Self-pay | Admitting: Pulmonary Disease

## 2019-02-08 ENCOUNTER — Ambulatory Visit (INDEPENDENT_AMBULATORY_CARE_PROVIDER_SITE_OTHER): Payer: Medicare Other | Admitting: Pulmonary Disease

## 2019-02-08 ENCOUNTER — Ambulatory Visit (INDEPENDENT_AMBULATORY_CARE_PROVIDER_SITE_OTHER): Payer: Medicare Other

## 2019-02-08 VITALS — BP 116/70 | HR 71 | Ht 65.0 in | Wt 120.0 lb

## 2019-02-08 DIAGNOSIS — R05 Cough: Secondary | ICD-10-CM

## 2019-02-08 DIAGNOSIS — R059 Cough, unspecified: Secondary | ICD-10-CM

## 2019-02-08 NOTE — Progress Notes (Signed)
Melissa Brennan    [id number]    1945-10-20  Primary Care Physician:Harwani, Prudencio Burly, MD  Referring Physician: Charolette Forward, MD [phone] [address] [place],  [address]  Chief complaint: Consult for chronic cough  HPI: 73 year old with history of atrial fibrillation, hypertension complains of chronic nonproductive cough for the past 4 months.  Cough is worsened after eating acidic foods such as orange, lemon, tomato.  She describes symptoms of reflux with sensation of bitter fluid in the mouth. She has been referred to Dr. Benson Norway, GI with EGD done which was normal as per patient.  She had been tried on omeprazole with no improvement in symptoms.  She stopped using the omeprazole and is trying Mylanta over-the-counter which does help some  Denies any dyspnea, mucus production, fevers, chills  Pets: No pets, birds Occupation: Runs a [employer] Exposures: No known exposures.  No hot tub, Jacuzzi, mold Smoking history: Never smoker Travel history: Originally from [place].  She has been in [place], [place] since 1993 Relevant family history: No significant family history of lung disease  Outpatient Encounter Medications as of 02/08/2019  Medication Sig  . amiodarone (PACERONE) 200 MG tablet Take 2 tablets (400 mg total) by mouth 2 (two) times daily.  Marland Kitchen apixaban (ELIQUIS) 5 MG TABS tablet Take 1 tablet (5 mg total) by mouth 2 (two) times daily.  Marland Kitchen aspirin EC 81 MG tablet Take 81 mg by mouth daily.  . cephALEXin (KEFLEX) 500 MG capsule Take 1 capsule (500 mg total) by mouth 2 (two) times daily.  . meclizine (ANTIVERT) 25 MG tablet Take 1 tablet (25 mg total) by mouth 3 (three) times daily as needed for dizziness.  Marland Kitchen olmesartan (BENICAR) 5 MG tablet Take 10 mg by mouth daily.  . phenazopyridine (PYRIDIUM) 200 MG tablet Take 1 tablet (200 mg total) by mouth 3 (three) times daily.   No facility-administered encounter medications on file as of 02/08/2019.      Allergies as of 02/08/2019  . (No Known Allergies)    Past Medical History:  Diagnosis Date  . A-fib ([place])   . Hypertension     No past surgical history on file.  No family history on file.  Social History   Socioeconomic History  . Marital status: Married    Spouse name: Not on file  . Number of children: Not on file  . Years of education: Not on file  . Highest education level: Not on file  Occupational History  . Not on file  Social Needs  . Financial resource strain: Not on file  . Food insecurity:    Worry: Not on file    Inability: Not on file  . Transportation needs:    Medical: Not on file    Non-medical: Not on file  Tobacco Use  . Smoking status: Never Smoker  . Smokeless tobacco: Never Used  Substance and Sexual Activity  . Alcohol use: No  . Drug use: No  . Sexual activity: Not on file  Lifestyle  . Physical activity:    Days per week: Not on file    Minutes per session: Not on file  . Stress: Not on file  Relationships  . Social connections:    Talks on phone: Not on file    Gets together: Not on file    Attends religious service: Not on file    Active member of club or organization: Not on file  Attends meetings of clubs or organizations: Not on file    Relationship status: Not on file  . Intimate partner violence:    Fear of current or ex partner: Not on file    Emotionally abused: Not on file    Physically abused: Not on file    Forced sexual activity: Not on file  Other Topics Concern  . Not on file  Social History Narrative  . Not on file    Review of systems: Review of Systems  Constitutional: Negative for fever and chills.  HENT: Negative.   Eyes: Negative for blurred vision.  Respiratory: as per HPI  Cardiovascular: Negative for chest pain and palpitations.  Gastrointestinal: Negative for vomiting, diarrhea, blood per rectum. Genitourinary: Negative for dysuria, urgency, frequency and hematuria.  Musculoskeletal:  Negative for myalgias, back pain and joint pain.  Skin: Negative for itching and rash.  Neurological: Negative for dizziness, tremors, focal weakness, seizures and loss of consciousness.  Endo/Heme/Allergies: Negative for environmental allergies.  Psychiatric/Behavioral: Negative for depression, suicidal ideas and hallucinations.  All other systems reviewed and are negative.  Physical Exam: Blood pressure 116/70, pulse 71, height 5\' 5"  (1.651 m), weight 120 lb (54.4 kg), SpO2 97 %. Gen:      No acute distress HEENT:  EOMI, sclera anicteric Neck:     No masses; no thyromegaly Lungs:    Clear to auscultation bilaterally; normal respiratory effort CV:         Regular rate and rhythm; no murmurs Abd:      + bowel sounds; soft, non-tender; no palpable masses, no distension Ext:    No edema; adequate peripheral perfusion Skin:      Warm and dry; no rash Neuro: alert and oriented x 3 Psych: normal mood and affect  Data Reviewed:  Assessment:  Chronic cough Likely secondary to acid reflux No clear evidence of lung issues.  Given her amiodarone use for the past year I will get chest x-ray for further evaluation We will order PFTs but this will likely be delayed due to restrictions of COVID-19  Advised her to try Pepcid at night, continue Mylanta Avoid spicy food.  Do not lie down at least 2 hours after eating Elevate head of bed.  Plan/Recommendations: - Chest x-ray  Marshell Garfinkel MD [facility] 02/08/2019, 9:35 AM  CC: Charolette Forward, MD

## 2019-02-08 NOTE — Patient Instructions (Signed)
We will get a chest x-ray today to evaluate the lungs Continue using the Maalox.  You can try Pepcid 20 mg at night over-the-counter Avoid spicy food and do not go to bed at least 2 hours after having dinner Elevate the head of the bed to reduce acid reflux  Follow-up in 1 to 2 months.

## 2019-02-13 ENCOUNTER — Telehealth: Payer: Self-pay | Admitting: Pulmonary Disease

## 2019-02-13 NOTE — Telephone Encounter (Signed)
Pt is calling back [phone]

## 2019-02-13 NOTE — Telephone Encounter (Signed)
Returned call to patient. LMTCB on both #s Please let patient know chest x-ray does not show any acute abnormality.

## 2019-02-13 NOTE — Telephone Encounter (Signed)
Pt aware cxr normal

## 2019-02-21 ENCOUNTER — Institutional Professional Consult (permissible substitution): Payer: Medicare Other | Admitting: Internal Medicine

## 2019-04-17 ENCOUNTER — Institutional Professional Consult (permissible substitution): Payer: Medicare Other | Admitting: Internal Medicine

## 2019-04-23 ENCOUNTER — Other Ambulatory Visit: Payer: Self-pay | Admitting: Obstetrics and Gynecology

## 2019-04-23 DIAGNOSIS — Z1231 Encounter for screening mammogram for malignant neoplasm of breast: Secondary | ICD-10-CM

## 2019-06-12 ENCOUNTER — Other Ambulatory Visit (HOSPITAL_COMMUNITY): Payer: Self-pay | Admitting: Gastroenterology

## 2019-06-12 ENCOUNTER — Other Ambulatory Visit: Payer: Self-pay | Admitting: Gastroenterology

## 2019-06-12 DIAGNOSIS — R110 Nausea: Secondary | ICD-10-CM

## 2019-06-18 ENCOUNTER — Ambulatory Visit (HOSPITAL_COMMUNITY): Payer: Medicare Other

## 2019-06-21 ENCOUNTER — Encounter (HOSPITAL_COMMUNITY): Payer: Self-pay

## 2019-06-21 ENCOUNTER — Other Ambulatory Visit: Payer: Self-pay

## 2019-06-21 ENCOUNTER — Inpatient Hospital Stay (HOSPITAL_COMMUNITY)
Admission: EM | Admit: 2019-06-21 | Discharge: 2019-06-23 | DRG: 812 | Disposition: A | Discharge status: Home / Self Care | Payer: Medicare Other | Attending: Cardiology | Admitting: Cardiology

## 2019-06-21 DIAGNOSIS — E785 Hyperlipidemia, unspecified: Secondary | ICD-10-CM | POA: Diagnosis present

## 2019-06-21 DIAGNOSIS — D509 Iron deficiency anemia, unspecified: Principal | ICD-10-CM | POA: Diagnosis present

## 2019-06-21 DIAGNOSIS — I480 Paroxysmal atrial fibrillation: Secondary | ICD-10-CM | POA: Diagnosis present

## 2019-06-21 DIAGNOSIS — K219 Gastro-esophageal reflux disease without esophagitis: Secondary | ICD-10-CM | POA: Diagnosis present

## 2019-06-21 DIAGNOSIS — Z79899 Other long term (current) drug therapy: Secondary | ICD-10-CM

## 2019-06-21 DIAGNOSIS — R531 Weakness: Secondary | ICD-10-CM | POA: Diagnosis not present

## 2019-06-21 DIAGNOSIS — Z20828 Contact with and (suspected) exposure to other viral communicable diseases: Secondary | ICD-10-CM | POA: Diagnosis present

## 2019-06-21 DIAGNOSIS — I10 Essential (primary) hypertension: Secondary | ICD-10-CM | POA: Diagnosis present

## 2019-06-21 DIAGNOSIS — K922 Gastrointestinal hemorrhage, unspecified: Secondary | ICD-10-CM | POA: Diagnosis present

## 2019-06-21 DIAGNOSIS — Z7901 Long term (current) use of anticoagulants: Secondary | ICD-10-CM

## 2019-06-21 DIAGNOSIS — D649 Anemia, unspecified: Secondary | ICD-10-CM

## 2019-06-21 DIAGNOSIS — K31819 Angiodysplasia of stomach and duodenum without bleeding: Secondary | ICD-10-CM | POA: Diagnosis present

## 2019-06-21 LAB — CBC
HCT: 21.8 % — ABNORMAL LOW (ref 36.0–46.0)
Hemoglobin: 6 g/dL — CL (ref 12.0–15.0)
MCH: 19 pg — ABNORMAL LOW (ref 26.0–34.0)
MCHC: 27.5 g/dL — ABNORMAL LOW (ref 30.0–36.0)
MCV: 69 fL — ABNORMAL LOW (ref 80.0–100.0)
Platelets: 245 10*3/uL (ref 150–400)
RBC: 3.16 MIL/uL — ABNORMAL LOW (ref 3.87–5.11)
RDW: 17.9 % — ABNORMAL HIGH (ref 11.5–15.5)
WBC: 9.1 10*3/uL (ref 4.0–10.5)
nRBC: 0 % (ref 0.0–0.2)

## 2019-06-21 LAB — BASIC METABOLIC PANEL
Anion gap: 10 (ref 5–15)
BUN: 14 mg/dL (ref 8–23)
CO2: 23 mmol/L (ref 22–32)
Calcium: 8.6 mg/dL — ABNORMAL LOW (ref 8.9–10.3)
Chloride: 107 mmol/L (ref 98–111)
Creatinine, Ser: 0.85 mg/dL (ref 0.44–1.00)
GFR calc Af Amer: >60 mL/min (ref >60)
GFR calc non Af Amer: >60 mL/min (ref >60)
Glucose, Bld: 118 mg/dL — ABNORMAL HIGH (ref 70–99)
Potassium: 3.6 mmol/L (ref 3.5–5.1)
Sodium: 140 mmol/L (ref 135–145)

## 2019-06-21 LAB — POC OCCULT BLOOD, ED: Fecal Occult Bld: NEGATIVE

## 2019-06-21 LAB — PREPARE RBC (CROSSMATCH)

## 2019-06-21 LAB — ABO/RH: ABO/RH(D): B POS

## 2019-06-21 MED ORDER — AMIODARONE HCL 200 MG PO TABS
200.0000 mg | ORAL_TABLET | Freq: Every day | ORAL | Status: DC
  Administered 2019-06-22 – 2019-06-23 (×2): 200 mg via ORAL
  Filled 2019-06-21 (×2): qty 1

## 2019-06-21 MED ORDER — SODIUM CHLORIDE 0.9% FLUSH: 3.0000 mL | Freq: Once | INTRAVENOUS | Status: DC

## 2019-06-21 MED ORDER — ROSUVASTATIN CALCIUM 5 MG PO TABS
10.0000 mg | ORAL_TABLET | Freq: Every day | ORAL | Status: DC
  Administered 2019-06-22: 20:00:00 10 mg via ORAL
  Filled 2019-06-21 (×2): qty 2

## 2019-06-21 MED ORDER — SODIUM CHLORIDE 0.9 % IV SOLN
INTRAVENOUS | Status: DC
  Administered 2019-06-22 (×2): via INTRAVENOUS

## 2019-06-21 MED ORDER — PANTOPRAZOLE SODIUM 40 MG PO TBEC
40.0000 mg | DELAYED_RELEASE_TABLET | Freq: Every day | ORAL | Status: DC
  Administered 2019-06-22 – 2019-06-23 (×2): 40 mg via ORAL
  Filled 2019-06-21 (×2): qty 1

## 2019-06-21 MED ORDER — SODIUM CHLORIDE 0.9% IV SOLUTION
Freq: Once | INTRAVENOUS | Status: AC
  Administered 2019-06-21: via INTRAVENOUS

## 2019-06-21 NOTE — ED Notes (Signed)
1st unit of blood infused. Pt denies pain when asked. VSS. Report given to 6N; nurse aware of remaining infusion needed.

## 2019-06-21 NOTE — ED Notes (Signed)
Floor unable to take report d/t shift change. Call back number provided.

## 2019-06-21 NOTE — H&P (Signed)
Melissa Brennan is an 73 y.o. female.   Chief Complaint: Generalized fatigue weakness and no energy HPI: Patient is 73 year old female with past medical history significant for hypertension, hyperlipidemia, paroxysmal A. fib chads Vas score of 2 on chronic anticoagulation, came to ER complaining of progressive increasing generalized fatigue weakness and no energy patient was noted to have hemoglobin of 6 on the lab done earlier this morning and was advised to come to the ED.  Patient denies any abdominal pain denies any chest pain shortness of breath.  Denies any syncopal episodes.  Denies any NSAIDs abuse.  States has occasionally noticed black tarry stools had upper endoscopy approximately 2 months ago which was normal as per patient.  Patient states also had colonoscopy which was negative approximately 2 years ago.  Repeat labs showed hemoglobin of 6 with microcytic anemia.  Stool for occult blood in the ED is negative.  Past Medical History:  Diagnosis Date  . A-fib ([place])   . Hypertension     History reviewed. No pertinent surgical history.  No family history on file. Social History:  reports that she has never smoked. She has never used smokeless tobacco. She reports that she does not drink alcohol or use drugs.  Allergies: No Known Allergies  (Not in a hospital admission)   Results for orders placed or performed during the hospital encounter of 06/21/19 (from the past 48 hour(s))  Type and screen [place]     Status: None (Preliminary result)   Collection Time: 06/21/19  4:00 PM  Result Value Ref Range   ABO/RH(D) B POS    Antibody Screen NEG    Sample Expiration 06/24/2019,2359    Unit Number [id number]    Blood Component Type RED CELLS,LR    Unit division 00    Status of Unit ISSUED    Transfusion Status OK TO TRANSFUSE    Crossmatch Result      Compatible Performed at [facility], [place] [address] [place], [place] [address]    Unit Number  [id number]    Blood Component Type RED CELLS,LR    Unit division 00    Status of Unit ALLOCATED    Transfusion Status OK TO TRANSFUSE    Crossmatch Result Compatible   ABO/Rh     Status: None   Collection Time: 06/21/19  4:00 PM  Result Value Ref Range   ABO/RH(D)      B POS Performed at [facility] [address] [place], [place] [id number]   Basic metabolic panel     Status: Abnormal   Collection Time: 06/21/19  4:06 PM  Result Value Ref Range   Sodium 140 135 - 145 mmol/L   Potassium 3.6 3.5 - 5.1 mmol/L   Chloride 107 98 - 111 mmol/L   CO2 23 22 - 32 mmol/L   Glucose, Bld 118 (H) 70 - 99 mg/dL   BUN 14 8 - 23 mg/dL   Creatinine, Ser 0.85 0.44 - 1.00 mg/dL   Calcium 8.6 (L) 8.9 - 10.3 mg/dL   GFR calc non Af Amer >60 >60 mL/min   GFR calc Af Amer >60 >60 mL/min   Anion gap 10 5 - 15    Comment: Performed at [facility], [place] [address], [place], [place] [address]  CBC     Status: Abnormal   Collection Time: 06/21/19  4:06 PM  Result Value Ref Range   WBC 9.1 4.0 - 10.5 K/uL  RBC 3.16 (L) 3.87 - 5.11 MIL/uL   Hemoglobin 6.0 (LL) 12.0 - 15.0 g/dL    Comment: REPEATED TO VERIFY Reticulocyte Hemoglobin testing may be clinically indicated, consider ordering this additional test [id number] THIS CRITICAL RESULT HAS VERIFIED AND BEEN CALLED TO T MORRIS RN BY KIRSTENE FORSYTH ON 09 25 2020 AT 1636, AND HAS BEEN READ BACK.     HCT 21.8 (L) 36.0 - 46.0 %   MCV 69.0 (L) 80.0 - 100.0 fL   MCH 19.0 (L) 26.0 - 34.0 pg   MCHC 27.5 (L) 30.0 - 36.0 g/dL   RDW 17.9 (H) 11.5 - 15.5 %   Platelets 245 150 - 400 K/uL   nRBC 0.0 0.0 - 0.2 %    Comment: Performed at [facility] [address], [place], [place] [address]  Prepare RBC     Status: None   Collection Time: 06/21/19  5:13 PM  Result Value Ref Range   Order Confirmation      ORDER PROCESSED BY BLOOD BANK Performed at [facility], [place] [address] [place], [place] [address]   POC occult blood,  ED Provider will collect     Status: None   Collection Time: 06/21/19  5:14 PM  Result Value Ref Range   Fecal Occult Bld NEGATIVE NEGATIVE   No results found.  Review of Systems  Constitutional: Positive for malaise/fatigue. Negative for chills, fever and weight loss.  HENT: Negative for hearing loss.   Eyes: Negative for blurred vision and double vision.  Respiratory: Negative for cough and shortness of breath.   Cardiovascular: Negative for chest pain, palpitations and leg swelling.  Gastrointestinal: Negative for abdominal pain, nausea and vomiting.  Genitourinary: Negative for dysuria.  Neurological: Positive for dizziness.    Blood pressure (!) 156/71, pulse 77, temperature 99 F (37.2 C), temperature source Oral, resp. rate 18, SpO2 100 %. Physical Exam  Constitutional: She is oriented to person, place, and time.  HENT:  Head: Normocephalic and atraumatic.  Eyes: Pupils are equal, round, and reactive to light. Left eye exhibits no discharge. No scleral icterus.  Conjunctiva pale  Neck: Normal range of motion. Neck supple.  Cardiovascular: Normal rate and regular rhythm.  Murmur (Soft systolic murmur noted no S3 gallop) heard. Respiratory: Effort normal and breath sounds normal.  GI: Soft. Bowel sounds are normal. She exhibits no distension. There is no abdominal tenderness. There is no rebound.  Musculoskeletal:        General: No tenderness, deformity or edema.  Neurological: She is alert and oriented to person, place, and time.     Assessment/Plan Symptomatic hypochromic microcytic anemia Rule out chronic GI loss Hypertension Paroxysmal A. fib chads vasc score of 2 on chronic anticoagulation Hyperlipidemia Plan As per orders  Charolette Forward, MD 06/21/2019, 5:50 PM

## 2019-06-21 NOTE — ED Triage Notes (Signed)
Pt reports dizziness/fatigue for the past few days. Pt has low hgb with pcp office. VSS, nad noted.

## 2019-06-21 NOTE — ED Notes (Signed)
ED TO INPATIENT HANDOFF REPORT  ED Nurse Name and Phone #: William Hamburger, [place]  S Name/Age/Gender Melissa Brennan 73 y.o. female Room/Bed: 014C/014C  Code Status   Code Status: Full Code  Home/SNF/Other Home Patient oriented to: self, place, time and situation Is this baseline? No   Triage Complete: Triage complete  Chief Complaint dizziness   Triage Note Pt reports dizziness/fatigue for the past few days. Pt has low hgb with pcp office. VSS, nad noted.    Allergies No Known Allergies  Level of Care/Admitting Diagnosis ED Disposition    ED Disposition Condition [facility] Area: [place] [100100]  Level of Care: Med-Surg [16]  Covid Evaluation: Asymptomatic Screening Protocol (No Symptoms)  Diagnosis: Chronic GI bleeding [id number]  Admitting Physician: Charolette Forward [[id number]]  Attending Physician: Charolette Forward [[id number]]  PT Class (Do Not Modify): Observation [104]  PT Acc Code (Do Not Modify): Observation [10022]       B Medical/Surgery History Past Medical History:  Diagnosis Date  . A-fib ([place])   . Hypertension    History reviewed. No pertinent surgical history.   A IV Location/Drains/Wounds Patient Lines/Drains/Airways Status   Active Line/Drains/Airways    Name:   Placement date:   Placement time:   Site:   Days:   Peripheral IV 06/21/19 Right Wrist   06/21/19    1710    Wrist   less than 1          Intake/Output Last 24 hours No intake or output data in the 24 hours ending 06/21/19 1905  Labs/Imaging Results for orders placed or performed during the hospital encounter of 06/21/19 (from the past 48 hour(s))  Type and screen [place]     Status: None (Preliminary result)   Collection Time: 06/21/19  4:00 PM  Result Value Ref Range   ABO/RH(D) B POS    Antibody Screen NEG    Sample Expiration 06/24/2019,2359    Unit Number [id number]    Blood Component Type RED CELLS,LR    Unit division 00     Status of Unit ISSUED    Transfusion Status OK TO TRANSFUSE    Crossmatch Result      Compatible Performed at [facility], [place] [address], [place], [place] [address]    Unit Number [id number]    Blood Component Type RED CELLS,LR    Unit division 00    Status of Unit ALLOCATED    Transfusion Status OK TO TRANSFUSE    Crossmatch Result Compatible   ABO/Rh     Status: None   Collection Time: 06/21/19  4:00 PM  Result Value Ref Range   ABO/RH(D)      B POS Performed at [facility] [address] [place], [place] [id number]   Basic metabolic panel     Status: Abnormal   Collection Time: 06/21/19  4:06 PM  Result Value Ref Range   Sodium 140 135 - 145 mmol/L   Potassium 3.6 3.5 - 5.1 mmol/L   Chloride 107 98 - 111 mmol/L   CO2 23 22 - 32 mmol/L   Glucose, Bld 118 (H) 70 - 99 mg/dL   BUN 14 8 - 23 mg/dL   Creatinine, Ser 0.85 0.44 - 1.00 mg/dL   Calcium 8.6 (L) 8.9 - 10.3 mg/dL   GFR calc non Af Amer >60 >60 mL/min   GFR calc Af Amer >60 >60 mL/min   Anion gap 10  5 - 15    Comment: Performed at [facility], [place] [address] [place], [place] [address]  CBC     Status: Abnormal   Collection Time: 06/21/19  4:06 PM  Result Value Ref Range   WBC 9.1 4.0 - 10.5 K/uL   RBC 3.16 (L) 3.87 - 5.11 MIL/uL   Hemoglobin 6.0 (LL) 12.0 - 15.0 g/dL    Comment: REPEATED TO VERIFY Reticulocyte Hemoglobin testing may be clinically indicated, consider ordering this additional test [id number] THIS CRITICAL RESULT HAS VERIFIED AND BEEN CALLED TO T MORRIS RN BY KIRSTENE FORSYTH ON 09 25 2020 AT 1636, AND HAS BEEN READ BACK.     HCT 21.8 (L) 36.0 - 46.0 %   MCV 69.0 (L) 80.0 - 100.0 fL   MCH 19.0 (L) 26.0 - 34.0 pg   MCHC 27.5 (L) 30.0 - 36.0 g/dL   RDW 17.9 (H) 11.5 - 15.5 %   Platelets 245 150 - 400 K/uL   nRBC 0.0 0.0 - 0.2 %    Comment: Performed at [facility] [address], [place], [place] [address]  Prepare RBC     Status: None   Collection Time:  06/21/19  5:13 PM  Result Value Ref Range   Order Confirmation      ORDER PROCESSED BY BLOOD BANK Performed at [facility], [place] [address], [place], [place] [address]   POC occult blood, ED Provider will collect     Status: None   Collection Time: 06/21/19  5:14 PM  Result Value Ref Range   Fecal Occult Bld NEGATIVE NEGATIVE   No results found.  Pending Labs Unresulted Labs (From admission, onward)    Start     Ordered   06/22/19 0500  CBC  Tomorrow morning,   R     06/21/19 1803   06/22/19 [id number]  Basic metabolic panel  Tomorrow morning,   R     06/21/19 1803   06/21/19 1802  Occult blood card to lab, stool  Once,   STAT     06/21/19 1803          Vitals/Pain Today's Vitals   06/21/19 1810 06/21/19 1815 06/21/19 1850 06/21/19 1851  BP: (!) 159/81 (!) 143/77  (!) 156/73  Pulse: 71 70  71  Resp: 19 10  18   Temp: 98.4 F (36.9 C)     TempSrc: Oral     SpO2: 100% 100%  100%  PainSc:   0-No pain     Isolation Precautions No active isolations  Medications Medications  sodium chloride flush (NS) 0.9 % injection 3 mL (has no administration in time range)  0.9 %  sodium chloride infusion (Manually program via Guardrails IV Fluids) (has no administration in time range)  amiodarone (PACERONE) tablet 200 mg (has no administration in time range)  rosuvastatin (CRESTOR) tablet 10 mg (has no administration in time range)  0.9 %  sodium chloride infusion (has no administration in time range)  pantoprazole (PROTONIX) EC tablet 40 mg (has no administration in time range)    Mobility walks Low fall risk   Focused Assessments Neuro Assessment Handoff:  Swallow screen pass? Yes          Neuro Assessment:   Neuro Checks:      Last Documented NIHSS Modified Score:   Has TPA been given? No If patient is a Neuro Trauma and patient is going to OR before floor call report to [facility] nurse: [phone]  or [phone]     R Recommendations: See Admitting  Provider Note  Report given to:   Additional Notes: 1 of 2 RBC, infusing.

## 2019-06-21 NOTE — ED Provider Notes (Addendum)
[facility] Provider Note   CSN: [id number] Arrival date & time: 06/21/19  1550     History   Chief Complaint Chief Complaint  Patient presents with  . Dizziness  . Anemia    HPI Melissa Brennan is a 73 y.o. female.     HPI  This patient is a 73 year old female presenting to the hospital at the request of the patient's primary doctor who is Melissa Brennan, he has had the patient on Eliquis for the last year and a half, check blood work today and found her hemoglobin to be 6.  She was complaining of being fatigued when she was walking but no shortness of breath lightheadedness syncope chest pain or cough.  These symptoms have been progressive, gradually worsening.  He sent her directly to the hospital for evaluation of severe anemia.  He has a known history of atrial fibrillation.  Past Medical History:  Diagnosis Date  . A-fib ([place])   . Hypertension     There are no active problems to display for this patient.   History reviewed. No pertinent surgical history.   OB History   No obstetric history on file.      Home Medications    Prior to Admission medications   Medication Sig Start Date End Date Taking? Authorizing Provider  amiodarone (PACERONE) 200 MG tablet Take 2 tablets (400 mg total) by mouth 2 (two) times daily. 11/27/17   Virgel Manifold, MD  apixaban (ELIQUIS) 5 MG TABS tablet Take 1 tablet (5 mg total) by mouth 2 (two) times daily. 11/27/17   Virgel Manifold, MD  aspirin EC 81 MG tablet Take 81 mg by mouth daily.    [provider]  cephALEXin (KEFLEX) 500 MG capsule Take 1 capsule (500 mg total) by mouth 2 (two) times daily. 11/12/17   Antonietta Breach, PA-C  meclizine (ANTIVERT) 25 MG tablet Take 1 tablet (25 mg total) by mouth 3 (three) times daily as needed for dizziness. 11/29/17   Dowless, Aldona Bar Tripp, PA-C  olmesartan (BENICAR) 5 MG tablet Take 10 mg by mouth daily.    [provider]  phenazopyridine  (PYRIDIUM) 200 MG tablet Take 1 tablet (200 mg total) by mouth 3 (three) times daily. 11/12/17   Antonietta Breach, PA-C    Family History No family history on file.  Social History Social History   Tobacco Use  . Smoking status: Never Smoker  . Smokeless tobacco: Never Used  Substance Use Topics  . Alcohol use: No  . Drug use: No     Allergies   Patient has no known allergies.   Review of Systems Review of Systems  All other systems reviewed and are negative.    Physical Exam Updated Vital Signs BP (!) 156/71   Pulse 77   Temp 99 F (37.2 C) (Oral)   Resp 18   SpO2 100%   Physical Exam Vitals signs and nursing note reviewed.  Constitutional:      General: She is not in acute distress.    Appearance: She is well-developed.  HENT:     Head: Normocephalic and atraumatic.     Mouth/Throat:     Pharynx: No oropharyngeal exudate.  Eyes:     General: No scleral icterus.       Right eye: No discharge.        Left eye: No discharge.     Pupils: Pupils are equal, round, and reactive to light.     Comments: Pale  conjunctive a  Neck:     Musculoskeletal: Normal range of motion and neck supple.     Thyroid: No thyromegaly.     Vascular: No JVD.  Cardiovascular:     Rate and Rhythm: Normal rate. Rhythm irregular.     Heart sounds: Normal heart sounds. No murmur. No friction rub. No gallop.   Pulmonary:     Effort: Pulmonary effort is normal. No respiratory distress.     Breath sounds: Normal breath sounds. No wheezing or rales.  Abdominal:     General: Bowel sounds are normal. There is no distension.     Palpations: Abdomen is soft. There is no mass.     Tenderness: There is no abdominal tenderness.     Comments: No abdominal tenderness whatsoever  Genitourinary:    Comments: Rectal exam chaperoned by nurse at bedside, normal-appearing anus, no fissures or hemorrhoids, stool is yellowish-brown and formed no masses palpated in the rectal vault Musculoskeletal: Normal  range of motion.        General: No tenderness.  Lymphadenopathy:     Cervical: No cervical adenopathy.  Skin:    General: Skin is warm and dry.     Findings: No erythema or rash.  Neurological:     Mental Status: She is alert.     Coordination: Coordination normal.  Psychiatric:        Behavior: Behavior normal.      ED Treatments / Results  Labs (all labs ordered are listed, but only abnormal results are displayed) Labs Reviewed  BASIC METABOLIC PANEL - Abnormal; Notable for the following components:      Result Value   Glucose, Bld 118 (*)    Calcium 8.6 (*)    All other components within normal limits  CBC - Abnormal; Notable for the following components:   RBC 3.16 (*)    Hemoglobin 6.0 (*)    HCT 21.8 (*)    MCV 69.0 (*)    MCH 19.0 (*)    MCHC 27.5 (*)    RDW 17.9 (*)    All other components within normal limits  POC OCCULT BLOOD, ED  TYPE AND SCREEN  ABO/RH  PREPARE RBC (CROSSMATCH)    EKG EKG Interpretation  Date/Time:  Friday June 21 2019 17:20:17 EDT Ventricular Rate:  72 PR Interval:    QRS Duration: 108 QT Interval:  444 QTC Calculation: 486 R Axis:   74 Text Interpretation:  Sinus rhythm Low voltage, extremity and precordial leads Probable anteroseptal infarct, old Poor R wave progression Confirmed by Noemi Chapel [phone]) on 06/21/2019 5:25:24 PM   Radiology No results found.  Procedures .Critical Care Performed by: Noemi Chapel, MD Authorized by: Noemi Chapel, MD   Critical care provider statement:    Critical care time (minutes):  35   Critical care time was exclusive of:  Separately billable procedures and treating other patients and teaching time   Critical care was necessary to treat or prevent imminent or life-threatening deterioration of the following conditions: severe anemia.   Critical care was time spent personally by me on the following activities:  Blood draw for specimens, development of treatment plan with patient or  surrogate, discussions with consultants, evaluation of patient's response to treatment, examination of patient, obtaining history from patient or surrogate, ordering and performing treatments and interventions, ordering and review of laboratory studies, ordering and review of radiographic studies, pulse oximetry, re-evaluation of patient's condition and review of old charts   (including critical care time)  Medications Ordered in ED Medications  sodium chloride flush (NS) 0.9 % injection 3 mL (has no administration in time range)  0.9 %  sodium chloride infusion (Manually program via Guardrails IV Fluids) (has no administration in time range)     Initial Impression / Assessment and Plan / ED Course  I have reviewed the triage vital signs and the nursing notes.  Pertinent labs & imaging results that were available during my care of the patient were reviewed by me and considered in my medical decision making (see chart for details).  Clinical Course as of Jun 20 1729  Fri Jun 21, 2019  1716 Hemoglobin(!!): 6.0 [BM]    Clinical Course User Index [BM] Noemi Chapel, MD       The patient has a soft nontender abdomen, her hemoglobin today is 6 and when compared with prior labs her last hemoglobin was 12.9 in March 2019.  Platelets are normal, metabolic panel is unremarkable she has been typed and screened, B positive  No obvious bleeding in the stool, the patient will have a Hemoccult performed, she is severely anemic and will likely need to be transfused.  Will admit to [facility], patient otherwise hemodynamically stable without tachycardia or hypotension.  Pt has had neg EGD ([facility]) and Colons[facility] 5+ years ago. Both negative - no acute findings  Hemoccult negative for blood.  D/w [facility] who will admit  Final Clinical Impressions(s) / ED Diagnoses   Final diagnoses:  Severe anemia      Noemi Chapel, MD 06/21/19 1730    Noemi Chapel, MD 06/21/19 1737     Noemi Chapel, MD 07/29/19 2332

## 2019-06-22 DIAGNOSIS — Z7901 Long term (current) use of anticoagulants: Secondary | ICD-10-CM | POA: Diagnosis not present

## 2019-06-22 DIAGNOSIS — K219 Gastro-esophageal reflux disease without esophagitis: Secondary | ICD-10-CM | POA: Diagnosis present

## 2019-06-22 DIAGNOSIS — I10 Essential (primary) hypertension: Secondary | ICD-10-CM | POA: Diagnosis present

## 2019-06-22 DIAGNOSIS — E785 Hyperlipidemia, unspecified: Secondary | ICD-10-CM | POA: Diagnosis present

## 2019-06-22 DIAGNOSIS — D509 Iron deficiency anemia, unspecified: Secondary | ICD-10-CM | POA: Diagnosis present

## 2019-06-22 DIAGNOSIS — Z20828 Contact with and (suspected) exposure to other viral communicable diseases: Secondary | ICD-10-CM | POA: Diagnosis present

## 2019-06-22 DIAGNOSIS — Z79899 Other long term (current) drug therapy: Secondary | ICD-10-CM | POA: Diagnosis not present

## 2019-06-22 DIAGNOSIS — I480 Paroxysmal atrial fibrillation: Secondary | ICD-10-CM | POA: Diagnosis present

## 2019-06-22 DIAGNOSIS — K31819 Angiodysplasia of stomach and duodenum without bleeding: Secondary | ICD-10-CM | POA: Diagnosis present

## 2019-06-22 DIAGNOSIS — R531 Weakness: Secondary | ICD-10-CM | POA: Diagnosis present

## 2019-06-22 LAB — CBC
HCT: 31.9 % — ABNORMAL LOW (ref 36.0–46.0)
Hemoglobin: 9.8 g/dL — ABNORMAL LOW (ref 12.0–15.0)
MCH: 22.3 pg — ABNORMAL LOW (ref 26.0–34.0)
MCHC: 30.7 g/dL (ref 30.0–36.0)
MCV: 72.7 fL — ABNORMAL LOW (ref 80.0–100.0)
Platelets: 206 10*3/uL (ref 150–400)
RBC: 4.39 MIL/uL (ref 3.87–5.11)
RDW: 20.8 % — ABNORMAL HIGH (ref 11.5–15.5)
WBC: 9.1 10*3/uL (ref 4.0–10.5)
nRBC: 0 % (ref 0.0–0.2)

## 2019-06-22 LAB — BASIC METABOLIC PANEL
Anion gap: 8 (ref 5–15)
BUN: 8 mg/dL (ref 8–23)
CO2: 23 mmol/L (ref 22–32)
Calcium: 8.4 mg/dL — ABNORMAL LOW (ref 8.9–10.3)
Chloride: 108 mmol/L (ref 98–111)
Creatinine, Ser: 0.78 mg/dL (ref 0.44–1.00)
GFR calc Af Amer: >60 mL/min (ref >60)
GFR calc non Af Amer: >60 mL/min (ref >60)
Glucose, Bld: 95 mg/dL (ref 70–99)
Potassium: 3.4 mmol/L — ABNORMAL LOW (ref 3.5–5.1)
Sodium: 139 mmol/L (ref 135–145)

## 2019-06-22 LAB — SARS CORONAVIRUS 2 (TAT 6-24 HRS): SARS Coronavirus 2: NEGATIVE

## 2019-06-22 MED ORDER — AMIODARONE HCL 200 MG PO TABS: 200.0000 mg | ORAL_TABLET | Freq: Every day | ORAL | 3 refills | Status: DC

## 2019-06-22 MED ORDER — PANTOPRAZOLE SODIUM 40 MG PO TBEC: 40.0000 mg | DELAYED_RELEASE_TABLET | Freq: Every day | ORAL | 3 refills | Status: DC

## 2019-06-22 NOTE — Discharge Instructions (Signed)
Anemia  Anemia is a condition in which you do not have enough red blood cells or hemoglobin. Hemoglobin is a substance in red blood cells that carries oxygen. When you do not have enough red blood cells or hemoglobin (are anemic), your body cannot get enough oxygen and your organs may not work properly. As a result, you may feel very tired or have other problems. What are the causes? Common causes of anemia include:  Excessive bleeding. Anemia can be caused by excessive bleeding inside or outside the body, including bleeding from the intestine or from periods in women.  Poor nutrition.  Long-lasting (chronic) kidney, thyroid, and liver disease.  Bone marrow disorders.  Cancer and treatments for cancer.  HIV (human immunodeficiency virus) and AIDS (acquired immunodeficiency syndrome).  Treatments for HIV and AIDS.  Spleen problems.  Blood disorders.  Infections, medicines, and autoimmune disorders that destroy red blood cells. What are the signs or symptoms? Symptoms of this condition include:  Minor weakness.  Dizziness.  Headache.  Feeling heartbeats that are irregular or faster than normal (palpitations).  Shortness of breath, especially with exercise.  Paleness.  Cold sensitivity.  Indigestion.  Nausea.  Difficulty sleeping.  Difficulty concentrating. Symptoms may occur suddenly or develop slowly. If your anemia is mild, you may not have symptoms. How is this diagnosed? This condition is diagnosed based on:  Blood tests.  Your medical history.  A physical exam.  Bone marrow biopsy. Your health care provider may also check your stool (feces) for blood and may do additional testing to look for the cause of your bleeding. You may also have other tests, including:  Imaging tests, such as a CT scan or MRI.  Endoscopy.  Colonoscopy. How is this treated? Treatment for this condition depends on the cause. If you continue to lose a lot of blood, you may  need to be treated at a hospital. Treatment may include:  Taking supplements of iron, vitamin M08, or folic acid.  Taking a hormone medicine (erythropoietin) that can help to stimulate red blood cell growth.  Having a blood transfusion. This may be needed if you lose a lot of blood.  Making changes to your diet.  Having surgery to remove your spleen. Follow these instructions at home:  Take over-the-counter and prescription medicines only as told by your health care provider.  Take supplements only as told by your health care provider.  Follow any diet instructions that you were given.  Keep all follow-up visits as told by your health care provider. This is important. Contact a health care provider if:  You develop new bleeding anywhere in the body. Get help right away if:  You are very weak.  You are short of breath.  You have pain in your abdomen or chest.  You are dizzy or feel faint.  You have trouble concentrating.  You have bloody or black, tarry stools.  You vomit repeatedly or you vomit up blood. Summary  Anemia is a condition in which you do not have enough red blood cells or enough of a substance in your red blood cells that carries oxygen (hemoglobin).  Symptoms may occur suddenly or develop slowly.  If your anemia is mild, you may not have symptoms.  This condition is diagnosed with blood tests as well as a medical history and physical exam. Other tests may be needed.  Treatment for this condition depends on the cause of the anemia. This information is not intended to replace advice given to you by  your health care provider. Make sure you discuss any questions you have with your health care provider. Document Released: 10/20/2004 Document Revised: 08/25/2017 Document Reviewed: 10/14/2016 Elsevier Patient Education  [facility].   Anemia  Anemia is a condition in which you do not have enough red blood cells or hemoglobin. Hemoglobin is a  substance in red blood cells that carries oxygen. When you do not have enough red blood cells or hemoglobin (are anemic), your body cannot get enough oxygen and your organs may not work properly. As a result, you may feel very tired or have other problems. What are the causes? Common causes of anemia include:  Excessive bleeding. Anemia can be caused by excessive bleeding inside or outside the body, including bleeding from the intestine or from periods in women.  Poor nutrition.  Long-lasting (chronic) kidney, thyroid, and liver disease.  Bone marrow disorders.  Cancer and treatments for cancer.  HIV (human immunodeficiency virus) and AIDS (acquired immunodeficiency syndrome).  Treatments for HIV and AIDS.  Spleen problems.  Blood disorders.  Infections, medicines, and autoimmune disorders that destroy red blood cells. What are the signs or symptoms? Symptoms of this condition include:  Minor weakness.  Dizziness.  Headache.  Feeling heartbeats that are irregular or faster than normal (palpitations).  Shortness of breath, especially with exercise.  Paleness.  Cold sensitivity.  Indigestion.  Nausea.  Difficulty sleeping.  Difficulty concentrating. Symptoms may occur suddenly or develop slowly. If your anemia is mild, you may not have symptoms. How is this diagnosed? This condition is diagnosed based on:  Blood tests.  Your medical history.  A physical exam.  Bone marrow biopsy. Your health care provider may also check your stool (feces) for blood and may do additional testing to look for the cause of your bleeding. You may also have other tests, including:  Imaging tests, such as a CT scan or MRI.  Endoscopy.  Colonoscopy. How is this treated? Treatment for this condition depends on the cause. If you continue to lose a lot of blood, you may need to be treated at a hospital. Treatment may include:  Taking supplements of iron, vitamin L39, or folic  acid.  Taking a hormone medicine (erythropoietin) that can help to stimulate red blood cell growth.  Having a blood transfusion. This may be needed if you lose a lot of blood.  Making changes to your diet.  Having surgery to remove your spleen. Follow these instructions at home:  Take over-the-counter and prescription medicines only as told by your health care provider.  Take supplements only as told by your health care provider.  Follow any diet instructions that you were given.  Keep all follow-up visits as told by your health care provider. This is important. Contact a health care provider if:  You develop new bleeding anywhere in the body. Get help right away if:  You are very weak.  You are short of breath.  You have pain in your abdomen or chest.  You are dizzy or feel faint.  You have trouble concentrating.  You have bloody or black, tarry stools.  You vomit repeatedly or you vomit up blood. Summary  Anemia is a condition in which you do not have enough red blood cells or enough of a substance in your red blood cells that carries oxygen (hemoglobin).  Symptoms may occur suddenly or develop slowly.  If your anemia is mild, you may not have symptoms.  This condition is diagnosed with blood tests as well  as a medical history and physical exam. Other tests may be needed.  Treatment for this condition depends on the cause of the anemia. This information is not intended to replace advice given to you by your health care provider. Make sure you discuss any questions you have with your health care provider. Document Released: 10/20/2004 Document Revised: 08/25/2017 Document Reviewed: 10/14/2016 Elsevier Patient Education  2020 [employer].

## 2019-06-22 NOTE — Consult Note (Addendum)
Consultation Weekend Coverage for Drs. Mann/Hung  Referring Provider: Dr. Terrence Dupont     Primary Care Physician:  Charolette Forward, MD Primary Gastroenterologist:  Dr. Carol Ada Reason for Consultation: Anemia              HPI:   [facility] is a 73 y.o. female with a past medical history of A. fib on Eliquis, who presented to the ER 06/21/2019 for complaint of generalized fatigue and weakness.  GI service was consulted for anemia.    Today, the patient explains that she has been having progressive increasing generalized fatigue and weakness.  She went to see her physician with labs that showed a hemoglobin of 6 and was told to come to the ED. Explains that she used to walk 4 miles and most recently she has hardly been able to walk to her mailbox without feeling fatigued and short of breath.  Does tell me that she has chronic reflux for which he is on Prilosec.  She has been using Mylanta and Pepcid for breakthrough symptoms for the past year.  Tells me that "nothing works for me".  Explains that she feels like she looks pale and just does not feel good.    Last dose of Eliquis was 9/20 AM.    Denies melena, blood in her stool, change in bowel habits or symptoms that awaken her from sleep.  ER course: Hemoccult stool negative, hgb 6     Previous GI history (per Dr. 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 Lelan Pons  Past Medical History:  Diagnosis Date  . A-fib ([place])   . Hypertension     History reviewed. No pertinent surgical history.  History reviewed. No pertinent family history.  Social History   Tobacco Use  . Smoking status: Never Smoker  . Smokeless tobacco: Never Used  Substance Use Topics  . Alcohol use: No  . Drug use: No    Prior to Admission medications   Medication Sig Start Date End Date Taking? Authorizing Provider  amiodarone (PACERONE) 200 MG tablet Take 2 tablets (400 mg total) by mouth 2 (two) times daily.  Patient taking differently: Take 200 mg by mouth daily.  11/27/17  Yes Virgel Manifold, MD  rosuvastatin (CRESTOR) 20 MG tablet Take 10 mg by mouth at bedtime.  04/08/19  Yes [provider]  apixaban (ELIQUIS) 5 MG TABS tablet Take 1 tablet (5 mg total) by mouth 2 (two) times daily. 11/27/17   Virgel Manifold, MD  meclizine (ANTIVERT) 25 MG tablet Take 1 tablet (25 mg total) by mouth 3 (three) times daily as needed for dizziness. Patient not taking: Reported on 06/21/2019 11/29/17   Dowless, Aldona Bar Tripp, PA-C  olmesartan (BENICAR) 20 MG tablet Take 20 mg by mouth daily.    [provider]    Current Facility-Administered Medications  Medication Dose Route Frequency Provider Last Rate Last Dose  . 0.9 %  sodium chloride infusion   Intravenous Continuous Charolette Forward, MD 50 mL/hr at 06/22/19 0005    . amiodarone (PACERONE) tablet 200 mg  200 mg Oral Daily Charolette Forward, MD      . pantoprazole (PROTONIX) EC tablet 40 mg  40 mg Oral Q0600 Charolette Forward, MD   40 mg at 06/22/19 [id number]  . rosuvastatin (CRESTOR) tablet 10 mg  10 mg Oral QHS Harwani, Prudencio Burly, MD      . sodium chloride flush (NS) 0.9 % injection 3 mL  3 mL  Intravenous Once Charolette Forward, MD        Allergies as of 06/21/2019  . (No Known Allergies)     Review of Systems:    Constitutional: +fatigue Skin: No rash  Cardiovascular: No chest pain Respiratory: No SOB Gastrointestinal: See HPI and otherwise negative Genitourinary: No dysuria or change in urinary frequency Neurological: +dizziness Musculoskeletal: No new muscle or joint pain Hematologic: No bruising Psychiatric: No history of depression or anxiety    Physical Exam:  Vital signs in last 24 hours: Temp:  [98 F (36.7 C)-99.8 F (37.7 C)] 99.8 F (37.7 C) (09/26 0615) Pulse Rate:  [64-77] 75 (09/26 0615) Resp:  [10-19] 18 (09/26 0615) BP: (143-162)/(68-81) 145/79 (09/26 0615) SpO2:  [96 %-100 %] 96 % (09/26 0615) Weight:  [52 kg] 52 kg (09/25  2035) Last BM Date: 06/21/19 General:   Pleasant female appears to be in NAD, Well developed, Well nourished, alert and cooperative Head:  Normocephalic and atraumatic. Eyes:   PEERL, EOMI. No icterus. Conjunctiva pink. Ears:  Normal auditory acuity. Neck:  Supple Throat: Oral cavity and pharynx without inflammation, swelling or lesion.  Lungs: Respirations even and unlabored. Lungs clear to auscultation bilaterally.   No wheezes, crackles, or rhonchi.  Heart: Normal S1, S2. + murmur Regular rate and rhythm. No peripheral edema, cyanosis or pallor.  Abdomen:  Soft, nondistended, nontender. No rebound or guarding. Normal bowel sounds. No appreciable masses or hepatomegaly. Rectal:  Not performed (in ER brown stool, hemoccult negative) Msk:  Symmetrical without gross deformities. Peripheral pulses intact.  Extremities:  Without edema, no deformity or joint abnormality.  Neurologic:  Alert and  oriented x4;  grossly normal neurologically.  Skin:   Dry and intact without significant lesions or rashes. Psychiatric: Demonstrates good judgement and reason without abnormal affect or behaviors.   LAB RESULTS: Recent Labs    06/21/19 1606 06/22/19 0525  WBC 9.1 9.1  HGB 6.0* 9.8*  HCT 21.8* 31.9*  PLT 245 206   BMET Recent Labs    06/21/19 1606 06/22/19 0525  NA 140 139  K 3.6 3.4*  CL 107 108  CO2 23 23  GLUCOSE 118* 95  BUN 14 8  CREATININE 0.85 0.78  CALCIUM 8.6* 8.4*     Impression / Plan:   Impression: 1.  Symptomatic microcytic anemia: Hemoglobin 6--> 3 units PRBCs--> 9.8, had progressive weakness, feeling some better today, recent colonoscopy and EGD which were unrevealing other than AVM which was nonbleeding at the time 2.  A. fib: On Eliquis, last dose 06/21/2019 morning 3. GERD: breakthrough symptoms, even on Prilosec 40mg  qd per patient  Plan: 1.  Agree with monitoring hemoglobin and transfusion as needed <7 2.  Agree with pantoprazole twice daily 3.  Will  discuss with Dr. Loletha Carrow, patient would likely benefit from capsule endoscopy in the future. 4.  Please await any further recommendations from Dr. Loletha Carrow later today.  Thank you for your kind consultation, we will continue to follow.  Lavone Nian Lemmon  06/22/2019, 10:10 AM  I have reviewed the entire case in detail with the above APP. In addition,  I have personally interviewed and examined the patient..  Last evening, I also spoke with Dr. Collene Mares, who conveyed results of outpatient testing.  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   My additional thoughts are as follows:  Chronic microcytic anemia causing symptoms of fatigue and dyspnea with exertion.  She is improved after  transfusion today.  Heme-negative on admission.  She has no abdominal pain and no overt GI bleeding reported.  There were no iron studies done, but they cannot be done at this point after she has received transfusion.  Assuming this is not lifelong microcytic anemia such as thalassemia, it is probably iron deficient.  That might be from poor iron absorption or intermittent obscure GI blood loss.  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  Consider extensive small bowel AVMs.  Perhaps it could be intermittent blood loss and distressed heme-negative on this occasion.  No further plans for endoscopic testing in hospital.  This is an outpatient work-up and she can be discharged home tomorrow from a GI perspective.  I will speak with Dr. Benson Norway so he can arrange any necessary follow-up.  Consider oral iron supplements in the meantime.  Signing off.  Nelida Meuse III Office:[phone]

## 2019-06-22 NOTE — Progress Notes (Signed)
Subjective:  Patient denies any chest pain or shortness of breath states overall feels better hemoglobin improved after 3 units of packed RBCs.  Complains of crampy pain in leg.  Objective:  Vital Signs in the last 24 hours: Temp:  [98 F (36.7 C)-99.8 F (37.7 C)] 99.8 F (37.7 C) (09/26 0615) Pulse Rate:  [64-77] 75 (09/26 0615) Resp:  [10-19] 18 (09/26 0615) BP: (143-162)/(68-81) 145/79 (09/26 0615) SpO2:  [96 %-100 %] 96 % (09/26 0615) Weight:  [52 kg] 52 kg (09/25 2035)  Intake/Output from previous day: 09/25 0701 - 09/26 0700 In: 644.1 [I.V.:329.1; Blood:315] Out: -  Intake/Output from this shift: No intake/output data recorded.  Physical Exam: Neck: no adenopathy, no carotid bruit, no JVD and supple, symmetrical, trachea midline Lungs: clear to auscultation bilaterally Heart: regular rate and rhythm, S1, S2 normal and Soft systolic murmur noted Abdomen: soft, non-tender; bowel sounds normal; no masses,  no organomegaly Extremities: extremities normal, atraumatic, no cyanosis or edema  Lab Results: Recent Labs    06/21/19 1606 06/22/19 0525  WBC 9.1 9.1  HGB 6.0* 9.8*  PLT 245 206   Recent Labs    06/21/19 1606 06/22/19 0525  NA 140 139  K 3.6 3.4*  CL 107 108  CO2 23 23  GLUCOSE 118* 95  BUN 14 8  CREATININE 0.85 0.78   No results for input(s): TROPONINI in the last 72 hours.  Invalid input(s): CK, MB Hepatic Function Panel No results for input(s): PROT, ALBUMIN, AST, ALT, ALKPHOS, BILITOT, BILIDIR, IBILI in the last 72 hours. No results for input(s): CHOL in the last 72 hours. No results for input(s): PROTIME in the last 72 hours.  Imaging: Imaging results have been reviewed and No results found.  Cardiac Studies:  Assessment/Plan:  Symptomatic hypochromic microcytic anemia Rule out chronic GI loss Hypertension Paroxysmal A. fib chads vasc score of 2 on chronic anticoagulation Hyperlipidemia Plan Continue present management Awaiting GI  recommendations Advance diet if okay with GI Check labs in a.m.  LOS: 0 days    Charolette Forward 06/22/2019, 10:41 AM

## 2019-06-23 LAB — TYPE AND SCREEN
ABO/RH(D): B POS
Antibody Screen: NEGATIVE
Unit division: 0
Unit division: 0

## 2019-06-23 LAB — BPAM RBC
Blood Product Expiration Date: 202010242359
Blood Product Expiration Date: 202010242359
ISSUE DATE / TIME: 202009251746
ISSUE DATE / TIME: 202009260022
Unit Type and Rh: 7300
Unit Type and Rh: 7300

## 2019-06-23 LAB — CBC
HCT: 32 % — ABNORMAL LOW (ref 36.0–46.0)
Hemoglobin: 9.9 g/dL — ABNORMAL LOW (ref 12.0–15.0)
MCH: 22.4 pg — ABNORMAL LOW (ref 26.0–34.0)
MCHC: 30.9 g/dL (ref 30.0–36.0)
MCV: 72.6 fL — ABNORMAL LOW (ref 80.0–100.0)
Platelets: 230 10*3/uL (ref 150–400)
RBC: 4.41 MIL/uL (ref 3.87–5.11)
RDW: 21.2 % — ABNORMAL HIGH (ref 11.5–15.5)
WBC: 8.8 10*3/uL (ref 4.0–10.5)
nRBC: 0 % (ref 0.0–0.2)

## 2019-06-23 LAB — BASIC METABOLIC PANEL
Anion gap: 10 (ref 5–15)
BUN: 12 mg/dL (ref 8–23)
CO2: 22 mmol/L (ref 22–32)
Calcium: 8.5 mg/dL — ABNORMAL LOW (ref 8.9–10.3)
Chloride: 108 mmol/L (ref 98–111)
Creatinine, Ser: 0.93 mg/dL (ref 0.44–1.00)
GFR calc Af Amer: >60 mL/min (ref >60)
GFR calc non Af Amer: >60 mL/min (ref >60)
Glucose, Bld: 106 mg/dL — ABNORMAL HIGH (ref 70–99)
Potassium: 3.6 mmol/L (ref 3.5–5.1)
Sodium: 140 mmol/L (ref 135–145)

## 2019-06-23 NOTE — Discharge Summary (Signed)
Discharge summary dictated on 06/23/2019 dictation number is [phone]

## 2019-06-24 NOTE — Discharge Summary (Signed)
NAMEGLENDA, FALLS MEDICAL RECORD Brennan ACCOUNT [id number] DATE OF BIRTH:November 21, 1945 FACILITY: [facility] LOCATION: [facility] PHYSICIAN:Emmalise Huard Daivd Council, MD  DISCHARGE SUMMARY  DATE OF DISCHARGE:  06/23/2019  ADMITTING DIAGNOSES: 1.  Symptomatic hypochromic microcytic anemia, rule out chronic gastrointestinal loss. 2.  Hypertension. 3.  Paroxysmal atrial fibrillation, CHADS-VASc score of 2, on chronic anticoagulation. 4.  Hyperlipidemia.  FINAL DIAGNOSES: 1.  Status post symptomatic hypochromic microcytic anemia, status post packed RBCs blood transfusion. 2.  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 3.  Hypertension. 4.  History of paroxysmal atrial fibrillation, CHADS-VASc score of 2.  The patient is off anticoagulation now. 5.  Hyperlipidemia.  DISCHARGE HOME MEDICATIONS:   1.  Pantoprazole 40 mg daily. 2.  Amiodarone 200 mg daily. 3.  Benicar 20 mg daily. 4.  Crestor 10 mg daily.   5.  The patient has been advised to stop Eliquis.  DIET:  Low salt, low cholesterol diet, advance diet as tolerated.  DISCHARGE INSTRUCTIONS:  The patient has been advised to avoid any NSAIDs, aspirin and stop Eliquis as above.  CONDITION AT DISCHARGE:  Stable.  FOLLOWUP:  With me in 1 week.  Follow up with Dr. [facility] in 1 week.  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  She came to the ER complaining of progressive increasing generalized fatigue, weakness, no energy.  The patient was noted to have hemoglobin of 6 on labs done earlier this morning and was advised to come to the ED.  The patient denies  any abdominal pain.  Denies any chest pain or shortness of breath.  Denies any syncopal episode.  Denies NSAIDs abuse.  States occasionally notices  black tarry stool, had upper endoscopy approximately 2 years ago, which was normal as per patient.  The  patient states had also colonoscopy which was negative approximately 2 years ago.  Repeat labs showed hemoglobin of 6 with microcytic anemia.  Stool for occult blood in ED was negative.  PHYSICAL EXAMINATION: GENERAL:  She was alert, awake, oriented x3. VITAL SIGNS:  Blood pressure was 156/71, pulse 77, temperature of 99 degrees Fahrenheit. HEENT:  Conjunctivae pale. NECK:  Supple, no JVD. LUNGS:  Clear to auscultation without rhonchi or rales. CARDIOVASCULAR:  S1, S2 was normal.  There was soft systolic murmur.  No S3 gallop. ABDOMEN:  Soft.  Bowel sounds are present, nontender. EXTREMITIES:  There is no clubbing, cyanosis or edema.  LABORATORY DATA:  Sodium was 140, potassium 3.6, BUN was 14, creatinine 0.85.  Her blood sugar was slightly elevated at 118.  Repeat fasting blood sugar was 95.  Hemoglobin was 6, hematocrit 21.8, MCV was 69.  Repeat hemoglobin post-transfusion yesterday  was 9.8, hematocrit 31.9.  Today, hemoglobin is 9.9, hematocrit 32, which has been stable.  Stool for occult blood was negative as above.  EKG showed normal sinus rhythm with poor R-wave progression in anterior leads.  BRIEF HOSPITAL COURSE:  The patient was admitted to regular floor.  GI consultation was obtained.  The patient received packed RBCs blood transfusion during the hospital stay.  The patient did not have any episodes of gastrointestinal bleeding during the  hospital stay.  Reviewing the old records suggested that the patient had nonbleeding duodenal AVM in the past.  Her hemoglobin has been stable post-transfusion.  The patient states she is feeling  stronger.  Her diet was advanced yesterday, which she is  tolerating it well.  The patient will be discharged home on above medications and will be followed up by me and GI next week.  The patient states she had an appointment to see GI early next week,  which she will keep the patient advised to call 911 if she  feels dizzy, lightheaded or notices further black tarry stools.  JN/NUANCE D:06/23/2019 T:06/24/2019 JOB:[id number]

## 2019-06-26 ENCOUNTER — Other Ambulatory Visit: Payer: Self-pay

## 2019-06-26 ENCOUNTER — Encounter (HOSPITAL_COMMUNITY)
Admission: RE | Admit: 2019-06-26 | Discharge: 2019-06-26 | Disposition: A | Discharge status: Home / Self Care | Payer: Medicare Other | Source: Ambulatory Visit | Attending: Gastroenterology | Admitting: Gastroenterology

## 2019-06-26 ENCOUNTER — Ambulatory Visit (HOSPITAL_COMMUNITY)
Admission: RE | Admit: 2019-06-26 | Discharge: 2019-06-26 | Disposition: A | Discharge status: Home / Self Care | Payer: Medicare Other | Source: Ambulatory Visit | Attending: Gastroenterology | Admitting: Gastroenterology

## 2019-06-26 DIAGNOSIS — R110 Nausea: Secondary | ICD-10-CM | POA: Insufficient documentation

## 2019-06-26 MED ORDER — TECHNETIUM TC 99M MEBROFENIN IV KIT
5.4100 | PACK | Freq: Once | INTRAVENOUS | Status: AC | PRN
  Administered 2019-06-26: 5.41 via INTRAVENOUS

## 2019-07-17 ENCOUNTER — Other Ambulatory Visit: Payer: Self-pay | Admitting: Gastroenterology

## 2019-07-23 ENCOUNTER — Other Ambulatory Visit (HOSPITAL_COMMUNITY)
Admission: RE | Admit: 2019-07-23 | Discharge: 2019-07-23 | Disposition: A | Discharge status: Home / Self Care | Payer: Medicare Other | Source: Ambulatory Visit | Attending: Gastroenterology | Admitting: Gastroenterology

## 2019-07-23 DIAGNOSIS — Z20828 Contact with and (suspected) exposure to other viral communicable diseases: Secondary | ICD-10-CM | POA: Insufficient documentation

## 2019-07-23 DIAGNOSIS — Z01812 Encounter for preprocedural laboratory examination: Secondary | ICD-10-CM | POA: Diagnosis present

## 2019-07-24 ENCOUNTER — Encounter (HOSPITAL_COMMUNITY): Payer: Self-pay | Admitting: *Deleted

## 2019-07-24 LAB — NOVEL CORONAVIRUS, NAA (HOSP ORDER, SEND-OUT TO REF LAB; TAT 18-24 HRS): SARS-CoV-2, NAA: NOT DETECTED

## 2019-07-24 NOTE — Progress Notes (Signed)
Pre-procedure call complete.  Pt does not want her driver (niece Patriciaann Lastrapes) updated with medical information, call for transport only.

## 2019-07-26 ENCOUNTER — Ambulatory Visit (HOSPITAL_COMMUNITY): Payer: Medicare Other | Admitting: Certified Registered Nurse Anesthetist

## 2019-07-26 ENCOUNTER — Ambulatory Visit (HOSPITAL_COMMUNITY)
Admission: RE | Admit: 2019-07-26 | Discharge: 2019-07-26 | Disposition: A | Discharge status: Home / Self Care | Payer: Medicare Other | Attending: Gastroenterology | Admitting: Gastroenterology

## 2019-07-26 ENCOUNTER — Encounter (HOSPITAL_COMMUNITY)
Admission: RE | Disposition: A | Discharge status: Home / Self Care | Payer: Self-pay | Source: Home / Self Care | Attending: Gastroenterology

## 2019-07-26 ENCOUNTER — Other Ambulatory Visit: Payer: Self-pay

## 2019-07-26 ENCOUNTER — Ambulatory Visit: Payer: Medicare Other

## 2019-07-26 DIAGNOSIS — I10 Essential (primary) hypertension: Secondary | ICD-10-CM | POA: Diagnosis not present

## 2019-07-26 DIAGNOSIS — Q2733 Arteriovenous malformation of digestive system vessel: Secondary | ICD-10-CM | POA: Diagnosis present

## 2019-07-26 DIAGNOSIS — K31819 Angiodysplasia of stomach and duodenum without bleeding: Secondary | ICD-10-CM | POA: Diagnosis not present

## 2019-07-26 DIAGNOSIS — K219 Gastro-esophageal reflux disease without esophagitis: Secondary | ICD-10-CM | POA: Insufficient documentation

## 2019-07-26 DIAGNOSIS — D509 Iron deficiency anemia, unspecified: Secondary | ICD-10-CM | POA: Diagnosis not present

## 2019-07-26 HISTORY — PX: HOT HEMOSTASIS: SHX5433

## 2019-07-26 HISTORY — PX: ESOPHAGOGASTRODUODENOSCOPY (EGD) WITH PROPOFOL: SHX5813

## 2019-07-26 SURGERY — ESOPHAGOGASTRODUODENOSCOPY (EGD) WITH PROPOFOL
Anesthesia: Monitor Anesthesia Care

## 2019-07-26 MED ORDER — LACTATED RINGERS IV SOLN
INTRAVENOUS | Status: AC | PRN
  Administered 2019-07-26: 1000 mL via INTRAVENOUS

## 2019-07-26 MED ORDER — PROPOFOL 500 MG/50ML IV EMUL
INTRAVENOUS | Status: DC | PRN
  Administered 2019-07-26: 125 ug/kg/min via INTRAVENOUS

## 2019-07-26 MED ORDER — LIDOCAINE HCL (CARDIAC) PF 100 MG/5ML IV SOSY
PREFILLED_SYRINGE | INTRAVENOUS | Status: DC | PRN
  Administered 2019-07-26: 100 mg via INTRAVENOUS

## 2019-07-26 MED ORDER — SODIUM CHLORIDE 0.9 % IV SOLN: INTRAVENOUS | Status: DC

## 2019-07-26 MED ORDER — PROPOFOL 10 MG/ML IV BOLUS
INTRAVENOUS | Status: DC | PRN
  Administered 2019-07-26: 20 mg via INTRAVENOUS
  Administered 2019-07-26: 30 mg via INTRAVENOUS

## 2019-07-26 SURGICAL SUPPLY — 15 items

## 2019-07-26 NOTE — H&P (Signed)
  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  She is here today for an enteroscopy and ablation of the AVM.  Past Medical History:  Diagnosis Date  . A-fib ([place])   . Hypertension     History reviewed. No pertinent surgical history.  History reviewed. No pertinent family history.  Social History:  reports that she has never smoked. She has never used smokeless tobacco. She reports that she does not drink alcohol or use drugs.  Allergies: No Known Allergies  Medications:  Scheduled:  Continuous: . sodium chloride    . lactated ringers 1,000 mL (07/26/19 0906)    No results found for this or any previous visit (from the past 24 hour(s)).   No results found.  ROS:  As stated above in the HPI otherwise negative.  Blood pressure (!) 151/70, pulse 67, temperature 98.2 F (36.8 C), temperature source Oral, resp. rate 13, height 5\' 4"  (1.626 m), weight 52.2 kg, SpO2 100 %.    PE: Gen: NAD, Alert and Oriented HEENT:  [place]/AT, EOMI Neck: Supple, no LAD Lungs: CTA Bilaterally CV: RRR without M/G/R ABM: Soft, NTND, +BS Ext: No C/C/E  Assessment/Plan: 1) IDA. 2) AVM.  Plan: 1) Enteroscopy with APC.  Hassen Bruun D 07/26/2019, 9:18 AM

## 2019-07-26 NOTE — Anesthesia Preprocedure Evaluation (Signed)
Anesthesia Evaluation  Patient identified by MRN, date of birth, ID band Patient awake    Reviewed: Allergy & Precautions, NPO status , Patient's Chart, lab work & pertinent test results  Airway Mallampati: II  TM Distance: >3 FB     Dental  (+) Dental Advisory Given   Pulmonary neg pulmonary ROS,    breath sounds clear to auscultation       Cardiovascular hypertension, Pt. on medications  Rhythm:Regular Rate:Normal     Neuro/Psych negative neurological ROS     GI/Hepatic Neg liver ROS, GERD  ,  Endo/Other  negative endocrine ROS  Renal/GU negative Renal ROS     Musculoskeletal   Abdominal   Peds  Hematology negative hematology ROS (+)   Anesthesia Other Findings   Reproductive/Obstetrics                             Anesthesia Physical Anesthesia Plan  ASA: III  Anesthesia Plan: MAC   Post-op Pain Management:    Induction: Intravenous  PONV Risk Score and Plan: 2 and Propofol infusion, Ondansetron and Treatment may vary due to age or medical condition  Airway Management Planned: Natural Airway and Nasal Cannula  Additional Equipment:   Intra-op Plan:   Post-operative Plan:   Informed Consent: I have reviewed the patients History and Physical, chart, labs and discussed the procedure including the risks, benefits and alternatives for the proposed anesthesia with the patient or authorized representative who has indicated his/her understanding and acceptance.       Plan Discussed with:   Anesthesia Plan Comments:         Anesthesia Quick Evaluation

## 2019-07-26 NOTE — Anesthesia Postprocedure Evaluation (Signed)
Anesthesia Post Note  Patient: Melissa Brennan  Procedure(s) Performed: ESOPHAGOGASTRODUODENOSCOPY (EGD) WITH PROPOFOL (N/A ) HOT HEMOSTASIS (ARGON PLASMA COAGULATION/BICAP) (N/A )     Patient location during evaluation: PACU Anesthesia Type: MAC Level of consciousness: awake and alert Pain management: pain level controlled Vital Signs Assessment: post-procedure vital signs reviewed and stable Respiratory status: spontaneous breathing, nonlabored ventilation, respiratory function stable and patient connected to nasal cannula oxygen Cardiovascular status: stable and blood pressure returned to baseline Postop Assessment: no apparent nausea or vomiting Anesthetic complications: no    Last Vitals:  Vitals:   07/26/19 1105 07/26/19 1110  BP:  140/82  Pulse: 60 61  Resp: (!) 9 11  Temp:    SpO2: 100% 100%    Last Pain:  Vitals:   07/26/19 1110  TempSrc:   PainSc: 0-No pain                 Tiajuana Amass

## 2019-07-26 NOTE — Op Note (Signed)
[facility] Patient Name: Melissa Brennan Procedure Date: 07/26/2019 MRN: [id number] Attending MD: Carol Ada , MD Date of Birth: 05/14/1946 CSN: [id number] Age: 73 Admit Type: Outpatient Procedure:                Small bowel enteroscopy Indications:              Arteriovenous malformation in the small intestine Providers:                Carol Ada, MD, Cleda Daub, RN, Elspeth Cho                            Tech., Technician, Stephanie [place] CRNA Referring MD:              Medicines:                Propofol per Anesthesia Complications:            No immediate complications. Estimated Blood Loss:     Estimated blood loss: none. Procedure:                Pre-Anesthesia Assessment:                           - Prior to the procedure, a History and Physical                            was performed, and patient medications and                            allergies were reviewed. The patient's tolerance of                            previous anesthesia was also reviewed. The risks                            and benefits of the procedure and the sedation                            options and risks were discussed with the patient.                            All questions were answered, and informed consent                            was obtained. Prior Anticoagulants: The patient has                            taken no previous anticoagulant or antiplatelet                            agents. ASA Grade Assessment: II - A patient with                            mild systemic disease. After reviewing the risks  and benefits, the patient was deemed in                            satisfactory condition to undergo the procedure.                           - Sedation was administered by an anesthesia                            professional. Deep sedation was attained.                           After obtaining informed consent, the endoscope was                           passed under direct vision. Throughout the                            procedure, the patient's blood pressure, pulse, and                            oxygen saturations were monitored continuously. The                            [id number] Olympus pediatric colonscope                            was introduced through the mouth, and advanced to                            the small bowel distal to the Ligament of Treitz.                            After obtaining informed consent, the endoscope was                            passed under direct vision. Throughout the                            procedure, the patient's blood pressure, pulse, and                            oxygen saturations were monitored continuously.The                            small bowel enteroscopy was accomplished without                            difficulty. The patient tolerated the procedure                            well. Scope In: Scope Out: Findings:      The esophagus was normal.      The stomach was normal.      A single angiodysplastic lesion with  no bleeding was found in the second       portion of the duodenum. Coagulation for tissue destruction using       monopolar probe was successful. Estimated blood loss: none.      A few angiodysplastic lesions with no bleeding were found in the       proximal jejunum. Coagulation for tissue destruction using monopolar       probe was successful.      Several small atypical AVMs were found in the proximal jejunum and they       were ablated with APC. Impression:               - Normal esophagus.                           - Normal stomach.                           - A single non-bleeding angiodysplastic lesion in                            the duodenum. Treated with a monopolar probe.                           - A few non-bleeding angiodysplastic lesions in the                            jejunum. Treated with a monopolar probe.                            - No specimens collected. Recommendation:           - Patient has a contact number available for                            emergencies. The signs and symptoms of potential                            delayed complications were discussed with the                            patient. Return to normal activities tomorrow.                            Written discharge instructions were provided to the                            patient.                           - Resume previous diet.                           - Return to GI clinic in 3 months. Procedure Code(s):        --- Professional ---                           [phone], Small intestinal  endoscopy, enteroscopy                            beyond second portion of duodenum, not including                            ileum; with ablation of tumor(s), polyp(s), or                            other lesion(s) not amenable to removal by hot                            biopsy forceps, bipolar cautery or snare technique Diagnosis Code(s):        --- Professional ---                           [id number], Angiodysplasia of stomach and duodenum                            without bleeding                           K55.20, Angiodysplasia of colon without hemorrhage CPT copyright 2019 American Medical Association. All rights reserved. The codes documented in this report are preliminary and upon coder review may  be revised to meet current compliance requirements. Carol Ada, MD Carol Ada, MD 07/26/2019 10:43:41 AM This report has been signed electronically. Number of Addenda: 0

## 2019-07-26 NOTE — Transfer of Care (Signed)
Immediate Anesthesia Transfer of Care Note  Patient: Melissa Brennan  Procedure(s) Performed: ESOPHAGOGASTRODUODENOSCOPY (EGD) WITH PROPOFOL (N/A ) HOT HEMOSTASIS (ARGON PLASMA COAGULATION/BICAP) (N/A )  Patient Location: endo  Anesthesia Type:MAC  Level of Consciousness: awake and drowsy  Airway & Oxygen Therapy: Patient Spontanous Breathing and Patient connected to face mask oxygen  Post-op Assessment: Report given to RN and Post -op Vital signs reviewed and stable  Post vital signs: Reviewed and stable  Last Vitals:  Vitals Value Taken Time  BP    Temp    Pulse    Resp    SpO2      Last Pain:  Vitals:   07/26/19 0855  TempSrc: Oral  PainSc: 0-No pain         Complications: No apparent anesthesia complications

## 2019-07-26 NOTE — Discharge Instructions (Signed)

## 2019-07-29 ENCOUNTER — Encounter (HOSPITAL_COMMUNITY): Payer: Self-pay | Admitting: Gastroenterology

## 2019-09-05 ENCOUNTER — Other Ambulatory Visit: Payer: Self-pay

## 2019-09-05 ENCOUNTER — Ambulatory Visit
Admission: RE | Admit: 2019-09-05 | Discharge: 2019-09-05 | Disposition: A | Discharge status: Home / Self Care | Payer: Medicare Other | Source: Ambulatory Visit | Attending: Obstetrics and Gynecology | Admitting: Obstetrics and Gynecology

## 2019-09-05 DIAGNOSIS — Z1231 Encounter for screening mammogram for malignant neoplasm of breast: Secondary | ICD-10-CM

## 2020-03-04 ENCOUNTER — Encounter (INDEPENDENT_AMBULATORY_CARE_PROVIDER_SITE_OTHER): Payer: Self-pay | Admitting: Otolaryngology

## 2020-03-04 ENCOUNTER — Other Ambulatory Visit: Payer: Self-pay

## 2020-03-04 ENCOUNTER — Ambulatory Visit (INDEPENDENT_AMBULATORY_CARE_PROVIDER_SITE_OTHER): Payer: Medicare Other | Admitting: Otolaryngology

## 2020-03-04 VITALS — Temp 97.0°F

## 2020-03-04 DIAGNOSIS — H6123 Impacted cerumen, bilateral: Secondary | ICD-10-CM

## 2020-03-04 NOTE — Progress Notes (Signed)
HPI: Melissa Brennan is a 74 y.o. female who presents for evaluation of wax buildup in her ears.  She was last seen a little over a year ago..  Past Medical History:  Diagnosis Date  . A-fib ([place])   . Hypertension    Past Surgical History:  Procedure Laterality Date  . ESOPHAGOGASTRODUODENOSCOPY (EGD) WITH PROPOFOL N/A 07/26/2019   Procedure: ESOPHAGOGASTRODUODENOSCOPY (EGD) WITH PROPOFOL;  Surgeon: Carol Ada, MD;  Location: [facility];  Service: Endoscopy;  Laterality: N/A;  . HOT HEMOSTASIS N/A 07/26/2019   Procedure: HOT HEMOSTASIS (ARGON PLASMA COAGULATION/BICAP);  Surgeon: Carol Ada, MD;  Location: [facility];  Service: Endoscopy;  Laterality: N/A;   Social History   Socioeconomic History  . Marital status: Married    Spouse name: Not on file  . Number of children: Not on file  . Years of education: Not on file  . Highest education level: Not on file  Occupational History  . Not on file  Tobacco Use  . Smoking status: Never Smoker  . Smokeless tobacco: Never Used  Substance and Sexual Activity  . Alcohol use: No  . Drug use: No  . Sexual activity: Not on file  Other Topics Concern  . Not on file  Social History Narrative  . Not on file   Social Determinants of Health   Financial Resource Strain:   . Difficulty of Paying Living Expenses:   Food Insecurity:   . Worried About Charity fundraiser in the Last Year:   . Arboriculturist in the Last Year:   Transportation Needs:   . Film/video editor (Medical):   Marland Kitchen Lack of Transportation (Non-Medical):   Physical Activity:   . Days of Exercise per Week:   . Minutes of Exercise per Session:   Stress:   . Feeling of Stress :   Social Connections:   . Frequency of Communication with Friends and Family:   . Frequency of Social Gatherings with Friends and Family:   . Attends Religious Services:   . Active Member of Clubs or Organizations:   . Attends Archivist Meetings:   Marland Kitchen Marital Status:     No family history on file. No Known Allergies Prior to Admission medications   Medication Sig Start Date End Date Taking? Authorizing Provider  acetaminophen (TYLENOL) 325 MG tablet Take 650 mg by mouth every 6 (six) hours as needed for moderate pain or headache.   Yes [provider]  amiodarone (PACERONE) 200 MG tablet Take 1 tablet (200 mg total) by mouth daily. 06/22/19  Yes Charolette Forward, MD  Ascorbic Acid (VITAMIN C PO) Take 1 tablet by mouth daily.   Yes [provider]  Azilsartan-Chlorthalidone (EDARBYCLOR) 40-12.5 MG TABS Take 0.5 tablets by mouth daily.   Yes [provider]  Cyanocobalamin (B-12 PO) Take 1 tablet by mouth daily.   Yes [provider]  Ferrous Sulfate (IRON PO) Take 1 tablet by mouth daily.   Yes [provider]  omeprazole (PRILOSEC) 20 MG capsule Take 20 mg by mouth daily.   Yes [provider]  rosuvastatin (CRESTOR) 10 MG tablet Take 10 mg by mouth every evening.   Yes [provider]     Positive ROS: Otherwise negative  All other systems have been reviewed and were otherwise negative with the exception of those mentioned in the HPI and as above.  Physical Exam: Constitutional: Alert, well-appearing, no acute distress Ears: External ears without lesions or tenderness. Ear canals  she has small ear canals bilaterally.  Ear canals were cleaned with curettes and forceps.  TMs were clear bilaterally.. Nasal: External nose without lesions. Clear nasal passages Oral: Oropharynx clear. Neck: No palpable adenopathy or masses Respiratory: Breathing comfortably  Skin: No facial/neck lesions or rash noted.  Cerumen impaction removal  Date/Time: 03/04/2020 8:44 AM Performed by: Rozetta Nunnery, MD Authorized by: Rozetta Nunnery, MD   Consent:    Consent obtained:  Verbal   Consent given by:  Patient   Risks discussed:  Pain and bleeding Procedure details:    Location:  L ear and R  ear   Procedure type: curette and forceps   Post-procedure details:    Inspection:  TM intact and canal normal   Hearing quality:  Improved   Patient tolerance of procedure:  Tolerated well, no immediate complications Comments:     TMs are clear bilaterally    Assessment: Bilateral wax buildup  Plan: This was cleaned in the office she will follow-up as needed  Radene Journey, MD

## 2020-03-06 ENCOUNTER — Ambulatory Visit (INDEPENDENT_AMBULATORY_CARE_PROVIDER_SITE_OTHER): Payer: Medicare Other

## 2020-03-06 ENCOUNTER — Other Ambulatory Visit: Payer: Self-pay

## 2020-03-06 ENCOUNTER — Encounter: Payer: Self-pay | Admitting: Podiatry

## 2020-03-06 ENCOUNTER — Ambulatory Visit (INDEPENDENT_AMBULATORY_CARE_PROVIDER_SITE_OTHER): Payer: Medicare Other | Admitting: Podiatry

## 2020-03-06 DIAGNOSIS — M2580 Other specified joint disorders, unspecified joint: Secondary | ICD-10-CM

## 2020-03-06 DIAGNOSIS — M2012 Hallux valgus (acquired), left foot: Secondary | ICD-10-CM | POA: Diagnosis not present

## 2020-03-06 NOTE — Progress Notes (Signed)
Subjective:  Patient ID: Melissa Brennan, female    DOB: 1945/12/10,  MRN: [id number]  Chief Complaint  Patient presents with  . Bunions    pt is here for a possible bunion of the left foot, pt states that the bunion has been going on for 27 years, and is overall painful to the touch.    74 y.o. female presents with the above complaint.  Patient presents with pain to the left bunion deformity with underlying sesamoiditis.  Patient states been going on for about quite some time.  Patient states the bunion has been going for last 28 years.  Is painful to walk on.  Pain is consistent.  Patient has tried soft shoes protecting it but has not helped.  Patient has tried different over-the-counter orthotics insoles but has not helped.  Patient has pain to the submetatarsal 1 but not as much pain to the medial prominence.  She denies any other acute complaints.  She denies seeing anyone else prior to seeing me.  Her pain is sharp shooting in nature.  Pain scale 6 out of 10.   Review of Systems: Negative except as noted in the HPI. Denies N/V/F/Ch.  Past Medical History:  Diagnosis Date  . A-fib ([place])   . Hypertension     Current Outpatient Medications:  .  acetaminophen (TYLENOL) 325 MG tablet, Take 650 mg by mouth every 6 (six) hours as needed for moderate pain or headache., Disp: , Rfl:  .  amiodarone (PACERONE) 200 MG tablet, Take 1 tablet (200 mg total) by mouth daily., Disp: 30 tablet, Rfl: 3 .  Ascorbic Acid (VITAMIN C PO), Take 1 tablet by mouth daily., Disp: , Rfl:  .  Azilsartan-Chlorthalidone (EDARBYCLOR) 40-12.5 MG TABS, Take 0.5 tablets by mouth daily., Disp: , Rfl:  .  Cyanocobalamin (B-12 PO), Take 1 tablet by mouth daily., Disp: , Rfl:  .  Ferrous Sulfate (IRON PO), Take 1 tablet by mouth daily., Disp: , Rfl:  .  omeprazole (PRILOSEC) 20 MG capsule, Take 20 mg by mouth daily., Disp: , Rfl:  .  rosuvastatin (CRESTOR) 10 MG tablet, Take 10 mg by mouth every evening., Disp: , Rfl:    Social History   Tobacco Use  Smoking Status Never Smoker  Smokeless Tobacco Never Used    No Known Allergies Objective:  There were no vitals filed for this visit. There is no height or weight on file to calculate BMI. Constitutional Well developed. Well nourished.  Vascular Dorsalis pedis pulses palpable bilaterally. Posterior tibial pulses palpable bilaterally. Capillary refill normal to all digits.  No cyanosis or clubbing noted. Pedal hair growth normal.  Neurologic Normal speech. Oriented to person, place, and time. Epicritic sensation to light touch grossly present bilaterally.  Dermatologic Nails well groomed and normal in appearance. No open wounds. No skin lesions.  Orthopedic:  Pain on palpation to the plantar aspect of the first metatarsophalangeal joint.  No pain with range of motion of the first metatarsophalangeal joint.  No intra-articular pain noted.  These are for the left side only.  No pain at the medial prominence of the bunion deformity.  Bunion deformity noted appears to be track bound not tracking deformity.  Hallux valgus noted.  Slight elevation of the second digit of the left side noted.  Hammertoe contracture of the second digit noted semiflexible in nature.   Radiographs: 3 views of skeletally mature adult foot left side: Moderate bunion deformity noted sesamoid position appears to be 5 out of 7.  Hallux valgus noted with a rotation to it.  There is increase in subchondral sclerosis of the base of the proximal phalanx.  Mild elevatus present.  Cavus foot type noted.  No other bony abnormalities identified. Assessment:   1. Hav (hallux abducto valgus), left    Plan:  Patient was evaluated and treated and all questions answered.  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  I believe that patient will benefit from a  steroid injection to help address the inflammation component associated with sesamoiditis.  Given that she has a little bit of the high arch foot patient stands to put excessive pressure on the first metatarsophalangeal joint.  Patient agrees with plan like to proceed with a steroid injection. -A steroid injection was performed at left sesamoiditis using 1% plain Lidocaine and 10 mg of Kenalog. This was well tolerated. -I discussed with the patient in extensive detail that if there is no resolve meant in inflammation I believe patient will benefit from a bunion correction with a mild dorsiflexor he osteotomy to help address the plantar flexor component of the metatarsal head onto the sesamoid.  Patient agrees with the plan and will reassess again in 4 weeks. -She will also benefit from cam boot immobilization.  Patient will be placed in cam boot.   No follow-ups on file.

## 2020-04-03 ENCOUNTER — Ambulatory Visit (INDEPENDENT_AMBULATORY_CARE_PROVIDER_SITE_OTHER): Payer: Medicare Other | Admitting: Podiatry

## 2020-04-03 ENCOUNTER — Other Ambulatory Visit: Payer: Self-pay

## 2020-04-03 DIAGNOSIS — M2580 Other specified joint disorders, unspecified joint: Secondary | ICD-10-CM

## 2020-04-03 DIAGNOSIS — M2012 Hallux valgus (acquired), left foot: Secondary | ICD-10-CM

## 2020-04-03 DIAGNOSIS — Z01818 Encounter for other preprocedural examination: Secondary | ICD-10-CM | POA: Diagnosis not present

## 2020-04-06 ENCOUNTER — Encounter: Payer: Self-pay | Admitting: Podiatry

## 2020-04-06 NOTE — Progress Notes (Signed)
Subjective:  Patient ID: Melissa Brennan, female    DOB: Dec 27, 1945,  MRN: [id number]  Chief Complaint  Patient presents with  . Bunions    4 wk fu bunion on LT ft     74 y.o. female presents with the above complaint.  Patient presents for painful left bunion deformity with underlying sesamoiditis.  Patient was last seen by me and was given a steroid injection which did not help.  Patient states it only gave her minimal relief and then the pain came back.  Patient states that she has tried padding in making shoe gear modification but has not helped.  She states is very painful to walk on.  She has tried various different orthotics but has not helped.  At this point patient has exhausted her conservative options and would like to discuss surgical options..   Review of Systems: Negative except as noted in the HPI. Denies N/V/F/Ch.  Past Medical History:  Diagnosis Date  . A-fib ([place])   . Hypertension     Current Outpatient Medications:  .  amiodarone (PACERONE) 100 MG tablet, Take 100 mg by mouth daily., Disp: , Rfl:  .  apixaban (ELIQUIS) 5 MG TABS tablet, Take 5 mg by mouth 2 (two) times daily., Disp: , Rfl:  .  olmesartan (BENICAR) 5 MG tablet, Take by mouth daily., Disp: , Rfl:  .  acetaminophen (TYLENOL) 325 MG tablet, Take 650 mg by mouth every 6 (six) hours as needed for moderate pain or headache., Disp: , Rfl:  .  Ascorbic Acid (VITAMIN C PO), Take 1 tablet by mouth daily., Disp: , Rfl:  .  Azilsartan-Chlorthalidone (EDARBYCLOR) 40-12.5 MG TABS, Take 0.5 tablets by mouth daily., Disp: , Rfl:  .  Cyanocobalamin (B-12 PO), Take 1 tablet by mouth daily., Disp: , Rfl:  .  Ferrous Sulfate (IRON PO), Take 1 tablet by mouth daily., Disp: , Rfl:  .  omeprazole (PRILOSEC) 20 MG capsule, Take 20 mg by mouth daily., Disp: , Rfl:  .  rosuvastatin (CRESTOR) 10 MG tablet, Take 10 mg by mouth every evening., Disp: , Rfl:   Social History   Tobacco Use  Smoking Status Never Smoker  Smokeless  Tobacco Never Used    No Known Allergies Objective:  There were no vitals filed for this visit. There is no height or weight on file to calculate BMI. Constitutional Well developed. Well nourished.  Vascular Dorsalis pedis pulses palpable bilaterally. Posterior tibial pulses palpable bilaterally. Capillary refill normal to all digits.  No cyanosis or clubbing noted. Pedal hair growth normal.  Neurologic Normal speech. Oriented to person, place, and time. Epicritic sensation to light touch grossly present bilaterally.  Dermatologic Nails well groomed and normal in appearance. No open wounds. No skin lesions.  Orthopedic:  Pain on palpation to the plantar aspect of the first metatarsophalangeal joint.  No pain with range of motion of the first metatarsophalangeal joint.  No intra-articular pain noted.  These are for the left side only.  No pain at the medial prominence of the bunion deformity.  Bunion deformity noted appears to be track bound not tracking deformity.  Hallux valgus noted.  Slight elevation of the second digit of the left side noted.  No hypermobility noted at the first tarsometatarsal joint.  Hammertoe contracture of the second digit noted semiflexible in nature.   Radiographs: 3 views of skeletally mature adult foot left side: Moderate bunion deformity noted sesamoid position appears to be 5 out of 7.  Hallux valgus noted  with a rotation to it.  There is increase in subchondral sclerosis of the base of the proximal phalanx.  Mild elevatus present.  Cavus foot type noted.  No other bony abnormalities identified.  Metatarsal parabola intact Assessment:   1. Hav (hallux abducto valgus), left   2. Sesamoiditis   3. Preoperative examination    Plan:  Patient was evaluated and treated and all questions answered.  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  Patient has exhausted all conservative options at this time and given that her pain is not getting resolved surgical options will be more beneficial for the patient.  Patient states understanding I explained my surgical planning in extensive detail to the patient.  Patient understands the plan and would like to proceed with surgical intervention at this time.  I also discussed with the patient in extensive detail my postop protocol including weightbearing as tolerated in a cam boot.  Patient has cam boot at home I have encouraged her to bring it with her to the surgical center.  My surgical plan includes left bunion correction with dorsiflexor osteotomy and a possible Akin osteotomy with screw fixation and staples. -She will bring her cam boot to the surgery center.-Informed surgical risk consent was reviewed and read aloud to the patient.  I reviewed the films.  I have discussed my findings with the patient in great detail.  I have discussed all risks including but not limited to infection, stiffness, scarring, limp, disability, deformity, damage to blood vessels and nerves, numbness, poor healing, need for braces, arthritis, chronic pain, amputation, death.  All benefits and realistic expectations discussed in great detail.  I have made no promises as to the outcome.  I have provided realistic expectations.  I have offered the patient a 2nd opinion, which they have declined and assured me they preferred to proceed despite the risks -A total of 36 minutes was spent in direct patient care as well as pre and post patient encounter activities.  This includes documentation as well as reviewing patient chart for labs, imaging, past medical, surgical, social, and family history as documented in the EMR.  I have reviewed medication allergies as  documented in EMR.  I discussed the etiology of condition and treatment options from conservative to surgical care.  All risks and benefit of the treatment course was discussed in detail.  All questions were answered and return appointment was discussed.  Since the visit completed in an ambulatory/outpatient setting, the patient and/or parent/guardian has been advised to contact the providers office for worsening condition and seek medical treatment and/or call 911 if the patient deems either is necessary.    No follow-ups on file.

## 2020-04-29 DIAGNOSIS — M2012 Hallux valgus (acquired), left foot: Secondary | ICD-10-CM | POA: Diagnosis not present

## 2020-04-29 DIAGNOSIS — M2042 Other hammer toe(s) (acquired), left foot: Secondary | ICD-10-CM | POA: Diagnosis not present

## 2020-04-29 MED ORDER — OXYCODONE-ACETAMINOPHEN 10-325 MG PO TABS: 1.0000 | ORAL_TABLET | Freq: Four times a day (QID) | ORAL | 0 refills | Status: AC | PRN

## 2020-04-29 MED ORDER — IBUPROFEN 800 MG PO TABS: 800.0000 mg | ORAL_TABLET | Freq: Four times a day (QID) | ORAL | 1 refills | Status: DC | PRN

## 2020-04-29 NOTE — Addendum Note (Signed)
Addended by: Boneta Lucks on: 04/29/2020 12:34 PM   Modules accepted: Orders

## 2020-05-06 ENCOUNTER — Ambulatory Visit (INDEPENDENT_AMBULATORY_CARE_PROVIDER_SITE_OTHER): Payer: Medicare Other | Admitting: Podiatry

## 2020-05-06 ENCOUNTER — Other Ambulatory Visit: Payer: Self-pay

## 2020-05-06 ENCOUNTER — Ambulatory Visit (INDEPENDENT_AMBULATORY_CARE_PROVIDER_SITE_OTHER): Payer: Medicare Other

## 2020-05-06 DIAGNOSIS — L89621 Pressure ulcer of left heel, stage 1: Secondary | ICD-10-CM

## 2020-05-06 DIAGNOSIS — M2580 Other specified joint disorders, unspecified joint: Secondary | ICD-10-CM

## 2020-05-06 DIAGNOSIS — M2012 Hallux valgus (acquired), left foot: Secondary | ICD-10-CM

## 2020-05-06 DIAGNOSIS — Z9889 Other specified postprocedural states: Secondary | ICD-10-CM

## 2020-05-07 ENCOUNTER — Encounter: Payer: Self-pay | Admitting: Podiatry

## 2020-05-07 NOTE — Progress Notes (Signed)
Subjective:  Patient ID: Melissa Brennan, female    DOB: [birth year],  MRN: [id number]  Chief Complaint  Patient presents with  . Routine Post Op    POV #1 DOS 04/29/20 LT CORRECTION OF BUNION W/PHALANX OSTEOTOMY     74 y.o. female returns for post-op check.  Patient is doing well.  She has had good pain control.  He is she has not been taking pain medication.  She has secondary complaint of left heel pressure sore that she is developed by emulating the cam boot.  She states she does placed in and out.  She just wants to get evaluated make sure that there is nothing worsening going on.  I discussed with her the importance of offloading as well.  She denies any other acute complaints.  Review of Systems: Negative except as noted in the HPI. Denies N/V/F/Ch.  Past Medical History:  Diagnosis Date  . A-fib ([place])   . Hypertension     Current Outpatient Medications:  .  acetaminophen (TYLENOL) 325 MG tablet, Take 650 mg by mouth every 6 (six) hours as needed for moderate pain or headache., Disp: , Rfl:  .  amiodarone (PACERONE) 100 MG tablet, Take 100 mg by mouth daily., Disp: , Rfl:  .  apixaban (ELIQUIS) 5 MG TABS tablet, Take 5 mg by mouth 2 (two) times daily., Disp: , Rfl:  .  Ascorbic Acid (VITAMIN C PO), Take 1 tablet by mouth daily., Disp: , Rfl:  .  Azilsartan-Chlorthalidone (EDARBYCLOR) 40-12.5 MG TABS, Take 0.5 tablets by mouth daily., Disp: , Rfl:  .  Cyanocobalamin (B-12 PO), Take 1 tablet by mouth daily., Disp: , Rfl:  .  Ferrous Sulfate (IRON PO), Take 1 tablet by mouth daily., Disp: , Rfl:  .  ibuprofen (ADVIL) 800 MG tablet, Take 1 tablet (800 mg total) by mouth every 6 (six) hours as needed., Disp: 60 tablet, Rfl: 1 .  olmesartan (BENICAR) 5 MG tablet, Take by mouth daily., Disp: , Rfl:  .  omeprazole (PRILOSEC) 20 MG capsule, Take 20 mg by mouth daily., Disp: , Rfl:  .  oxyCODONE-acetaminophen (PERCOCET) 10-325 MG tablet, Take 1 tablet by mouth every 6 (six) hours as needed  for up to 8 days for pain., Disp: 30 tablet, Rfl: 0 .  rosuvastatin (CRESTOR) 10 MG tablet, Take 10 mg by mouth every evening., Disp: , Rfl:   Social History   Tobacco Use  Smoking Status Never Smoker  Smokeless Tobacco Never Used    No Known Allergies Objective:  There were no vitals filed for this visit. There is no height or weight on file to calculate BMI. Constitutional Well developed. Well nourished.  Vascular Foot warm and well perfused. Capillary refill normal to all digits.   Neurologic Normal speech. Oriented to person, place, and time. Epicritic sensation to light touch grossly present bilaterally.  Dermatologic Skin healing well without signs of infection. Skin edges well coapted without signs of infection.  Orthopedic: Tenderness to palpation noted about the surgical site.  Pressure injury noted to the left heel likely due to dependent position as well as pistoning from the cam boot.  No open wound or ulceration noted.   Radiographs: 3 views of skeletally mature adult left foot: Good correction alignment noted.  Hardware is intact without any signs of blackening or loosening. Assessment:   1. Hav (hallux abducto valgus), left   2. Sesamoiditis   3. Status post foot surgery   4. Pressure injury of left heel, stage 1  Plan:  Patient was evaluated and treated and all questions answered.  S/p foot surgery left -Progressing as expected post-operatively. -XR: See above -WB Status: Weightbearing as tolerated in cam boot -Sutures: Intact.  No signs of dehiscence noted -Medications: None -Foot redressed.  Left heel pressure injury -Likely due to failure of aggressively offloading the heel as well as improper use of the cam boot.  I discussed with the patient the ways to prevent pressure injury to the heel including offloading with a pillow to the back of the leg to allow the heel to not be putting weight against a cam boot.  Patient states understanding and she  will start doing the right away.  At this time unlikely concern and issues self resolve as patient does not have any underlying wound problems.  No follow-ups on file.

## 2020-05-08 ENCOUNTER — Ambulatory Visit (INDEPENDENT_AMBULATORY_CARE_PROVIDER_SITE_OTHER): Payer: Medicare Other | Admitting: Podiatry

## 2020-05-08 ENCOUNTER — Other Ambulatory Visit: Payer: Self-pay

## 2020-05-08 ENCOUNTER — Telehealth: Payer: Self-pay | Admitting: Podiatry

## 2020-05-08 DIAGNOSIS — L89621 Pressure ulcer of left heel, stage 1: Secondary | ICD-10-CM | POA: Diagnosis not present

## 2020-05-12 ENCOUNTER — Encounter: Payer: Self-pay | Admitting: Podiatry

## 2020-05-12 NOTE — Progress Notes (Signed)
Subjective:  Patient ID: Melissa Brennan, female    DOB: 02-26-1946,  MRN: [id number]  Chief Complaint  Patient presents with  . Foot Pain    pt is here for left foot f/u. Pt states that she is feeling a little bit better and has no overall comments or concerns     74 y.o. female returns for post-op check.  Patient is doing well.  She is doing great from the surgical site.  She is has good pain control however she is presenting again for left heel pressure injury that has been hurting her a lot.  She is not able to ambulate on it at all.  She denies any other acute complaints. Review of Systems: Negative except as noted in the HPI. Denies N/V/F/Ch.  Past Medical History:  Diagnosis Date  . A-fib ([place])   . Hypertension     Current Outpatient Medications:  .  acetaminophen (TYLENOL) 325 MG tablet, Take 650 mg by mouth every 6 (six) hours as needed for moderate pain or headache., Disp: , Rfl:  .  amiodarone (PACERONE) 100 MG tablet, Take 100 mg by mouth daily., Disp: , Rfl:  .  apixaban (ELIQUIS) 5 MG TABS tablet, Take 5 mg by mouth 2 (two) times daily., Disp: , Rfl:  .  Ascorbic Acid (VITAMIN C PO), Take 1 tablet by mouth daily., Disp: , Rfl:  .  Azilsartan-Chlorthalidone (EDARBYCLOR) 40-12.5 MG TABS, Take 0.5 tablets by mouth daily., Disp: , Rfl:  .  Cyanocobalamin (B-12 PO), Take 1 tablet by mouth daily., Disp: , Rfl:  .  Ferrous Sulfate (IRON PO), Take 1 tablet by mouth daily., Disp: , Rfl:  .  ibuprofen (ADVIL) 800 MG tablet, Take 1 tablet (800 mg total) by mouth every 6 (six) hours as needed., Disp: 60 tablet, Rfl: 1 .  olmesartan (BENICAR) 5 MG tablet, Take by mouth daily., Disp: , Rfl:  .  omeprazole (PRILOSEC) 20 MG capsule, Take 20 mg by mouth daily., Disp: , Rfl:  .  rosuvastatin (CRESTOR) 10 MG tablet, Take 10 mg by mouth every evening., Disp: , Rfl:   Social History   Tobacco Use  Smoking Status Never Smoker  Smokeless Tobacco Never Used    No Known  Allergies Objective:  There were no vitals filed for this visit. There is no height or weight on file to calculate BMI. Constitutional Well developed. Well nourished.  Vascular Foot warm and well perfused. Capillary refill normal to all digits.   Neurologic Normal speech. Oriented to person, place, and time. Epicritic sensation to light touch grossly present bilaterally.  Dermatologic Skin healing well without signs of infection. Skin edges well coapted without signs of infection.  Orthopedic: Tenderness to palpation noted about the surgical site.  Pressure injury noted to the left heel likely due to dependent position as well as pistoning from the cam boot.  No open wound or ulceration noted.   Radiographs: 3 views of skeletally mature adult left foot: Good correction alignment noted.  Hardware is intact without any signs of blackening or loosening. Assessment:   No diagnosis found. Plan:  Patient was evaluated and treated and all questions answered.  S/p foot surgery left -Progressing as expected post-operatively. -XR: See above -WB Status: Weightbearing as tolerated in cam boot -Sutures: Intact.  No signs of dehiscence noted -Medications: None -Foot redressed.  Left heel pressure injury -Likely due to failure of aggressively offloading the heel as well as improper use of the cam boot.  I discussed with  the patient the ways to prevent pressure injury to the heel including offloading with a pillow to the back of the leg to allow the heel to not be putting weight against a cam boot.  Given that the cam boot continues to hurt and patient has states that the surgical shoe is not helping I believe the pain/pressure injury might have happened from different cam boot that what patient has used in the past.  I dispensed a new cam boot to the patient.  I have discussed with her that this turned into an ulceration, see me right away.  Patient states understanding.  No follow-ups on file.

## 2020-05-13 ENCOUNTER — Other Ambulatory Visit: Payer: Self-pay

## 2020-05-13 ENCOUNTER — Telehealth: Payer: Self-pay | Admitting: Podiatry

## 2020-05-13 ENCOUNTER — Ambulatory Visit (INDEPENDENT_AMBULATORY_CARE_PROVIDER_SITE_OTHER): Payer: Medicare Other | Admitting: Podiatry

## 2020-05-13 DIAGNOSIS — L89621 Pressure ulcer of left heel, stage 1: Secondary | ICD-10-CM

## 2020-05-13 DIAGNOSIS — Z9889 Other specified postprocedural states: Secondary | ICD-10-CM

## 2020-05-13 DIAGNOSIS — M2012 Hallux valgus (acquired), left foot: Secondary | ICD-10-CM

## 2020-05-13 NOTE — Telephone Encounter (Signed)
Betsy from [employer] called an dwould like verbal orders to extens pt frequency 2x week for 2wks and 1x  week for 3wks

## 2020-05-14 ENCOUNTER — Encounter: Payer: Self-pay | Admitting: Podiatry

## 2020-05-14 NOTE — Progress Notes (Signed)
Subjective:  Patient ID: Melissa Brennan, female    DOB: [birth year],  MRN: [id number]  No chief complaint on file.    74 y.o. female returns for post-op check.  Patient is doing well.  She is doing great from the surgical site.  She is has good pain control however she is presenting again for left heel pressure injury that has been hurting her a lot.  She is not able to ambulate on it at all.  She denies any other acute complaints. Review of Systems: Negative except as noted in the HPI. Denies N/V/F/Ch.  Past Medical History:  Diagnosis Date  . A-fib ([place])   . Hypertension     Current Outpatient Medications:  .  acetaminophen (TYLENOL) 325 MG tablet, Take 650 mg by mouth every 6 (six) hours as needed for moderate pain or headache., Disp: , Rfl:  .  amiodarone (PACERONE) 100 MG tablet, Take 100 mg by mouth daily., Disp: , Rfl:  .  apixaban (ELIQUIS) 5 MG TABS tablet, Take 5 mg by mouth 2 (two) times daily., Disp: , Rfl:  .  Ascorbic Acid (VITAMIN C PO), Take 1 tablet by mouth daily., Disp: , Rfl:  .  Azilsartan-Chlorthalidone (EDARBYCLOR) 40-12.5 MG TABS, Take 0.5 tablets by mouth daily., Disp: , Rfl:  .  Cyanocobalamin (B-12 PO), Take 1 tablet by mouth daily., Disp: , Rfl:  .  Ferrous Sulfate (IRON PO), Take 1 tablet by mouth daily., Disp: , Rfl:  .  ibuprofen (ADVIL) 800 MG tablet, Take 1 tablet (800 mg total) by mouth every 6 (six) hours as needed., Disp: 60 tablet, Rfl: 1 .  olmesartan (BENICAR) 5 MG tablet, Take by mouth daily., Disp: , Rfl:  .  omeprazole (PRILOSEC) 20 MG capsule, Take 20 mg by mouth daily., Disp: , Rfl:  .  rosuvastatin (CRESTOR) 10 MG tablet, Take 10 mg by mouth every evening., Disp: , Rfl:   Social History   Tobacco Use  Smoking Status Never Smoker  Smokeless Tobacco Never Used    No Known Allergies Objective:  There were no vitals filed for this visit. There is no height or weight on file to calculate BMI. Constitutional Well developed. Well  nourished.  Vascular Foot warm and well perfused. Capillary refill normal to all digits.   Neurologic Normal speech. Oriented to person, place, and time. Epicritic sensation to light touch grossly present bilaterally.  Dermatologic Skin healing well without signs of infection. Skin edges well coapted without signs of infection.  Orthopedic: Tenderness to palpation noted about the surgical site.  Pressure injury noted to the left heel likely due to dependent position as well as pistoning from the cam boot.  No open wound or ulceration noted.   Radiographs: 3 views of skeletally mature adult left foot: Good correction alignment noted.  Hardware is intact without any signs of blackening or loosening. Assessment:   1. Pressure injury of left heel, stage 1   2. Status post foot surgery   3. Hav (hallux abducto valgus), left    Plan:  Patient was evaluated and treated and all questions answered.  S/p foot surgery left -Progressing as expected post-operatively. -XR: See above -WB Status: Ideally I would have like patient to be in a surgical shoe however given the posterior pressure injury patient has self transition into regular flat shoes. -Sutures: Were removed. No signs of dehiscence noted. No clinical signs of infection noted. -Medications: None -Foot redressed.  Left heel pressure injury -Clinically the pressure injury is improving.  Patient is transition to regular sneaker with open back. She has been able to tolerate that much more.  No follow-ups on file.

## 2020-05-14 NOTE — Telephone Encounter (Signed)
That is fine.   Thank you

## 2020-05-15 ENCOUNTER — Other Ambulatory Visit: Payer: Self-pay

## 2020-05-15 ENCOUNTER — Ambulatory Visit (INDEPENDENT_AMBULATORY_CARE_PROVIDER_SITE_OTHER): Payer: Medicare Other | Admitting: Podiatry

## 2020-05-15 DIAGNOSIS — Z9889 Other specified postprocedural states: Secondary | ICD-10-CM

## 2020-05-15 DIAGNOSIS — M2012 Hallux valgus (acquired), left foot: Secondary | ICD-10-CM | POA: Diagnosis not present

## 2020-05-15 DIAGNOSIS — S90822A Blister (nonthermal), left foot, initial encounter: Secondary | ICD-10-CM

## 2020-05-15 MED ORDER — DOXYCYCLINE HYCLATE 100 MG PO TABS: 100.0000 mg | ORAL_TABLET | Freq: Two times a day (BID) | ORAL | 0 refills | Status: DC

## 2020-05-18 ENCOUNTER — Other Ambulatory Visit: Payer: Self-pay

## 2020-05-18 ENCOUNTER — Ambulatory Visit (INDEPENDENT_AMBULATORY_CARE_PROVIDER_SITE_OTHER): Payer: Medicare Other | Admitting: Podiatry

## 2020-05-18 ENCOUNTER — Ambulatory Visit (INDEPENDENT_AMBULATORY_CARE_PROVIDER_SITE_OTHER): Payer: Medicare Other

## 2020-05-18 VITALS — Temp 99.5°F

## 2020-05-18 DIAGNOSIS — M2012 Hallux valgus (acquired), left foot: Secondary | ICD-10-CM

## 2020-05-18 DIAGNOSIS — Z9889 Other specified postprocedural states: Secondary | ICD-10-CM

## 2020-05-18 DIAGNOSIS — L89621 Pressure ulcer of left heel, stage 1: Secondary | ICD-10-CM

## 2020-05-18 IMAGING — MG DIGITAL SCREENING BILAT W/ TOMO W/ CAD
8 series · 9 of 24 positions shown · non-contrast
Comparison: Previous exam(s).

CLINICAL DATA: Screening.

EXAM:
DIGITAL SCREENING BILATERAL MAMMOGRAM WITH TOMO AND CAD

[L CC synth-2D]
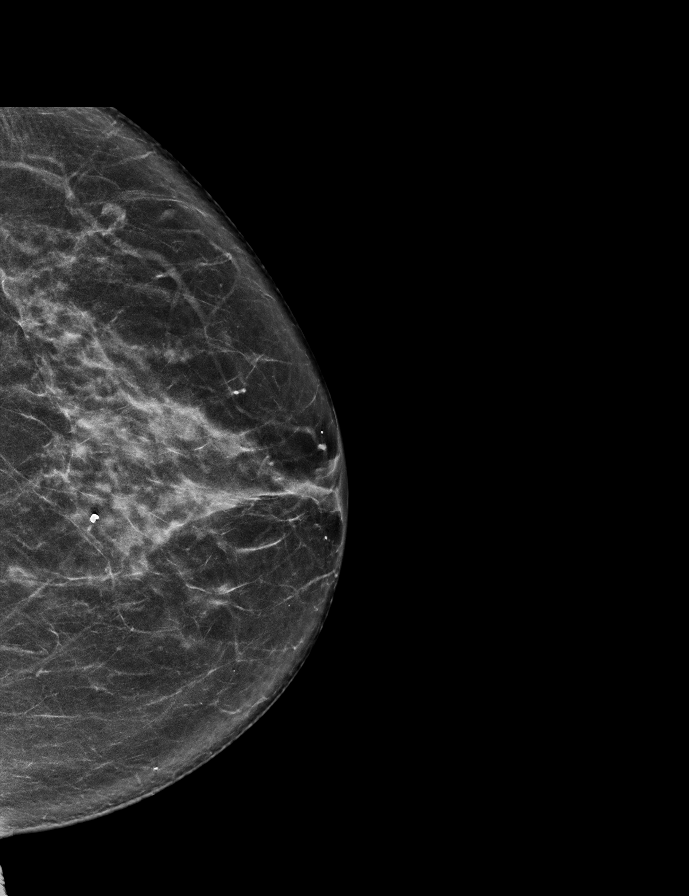

[R CC synth-2D]
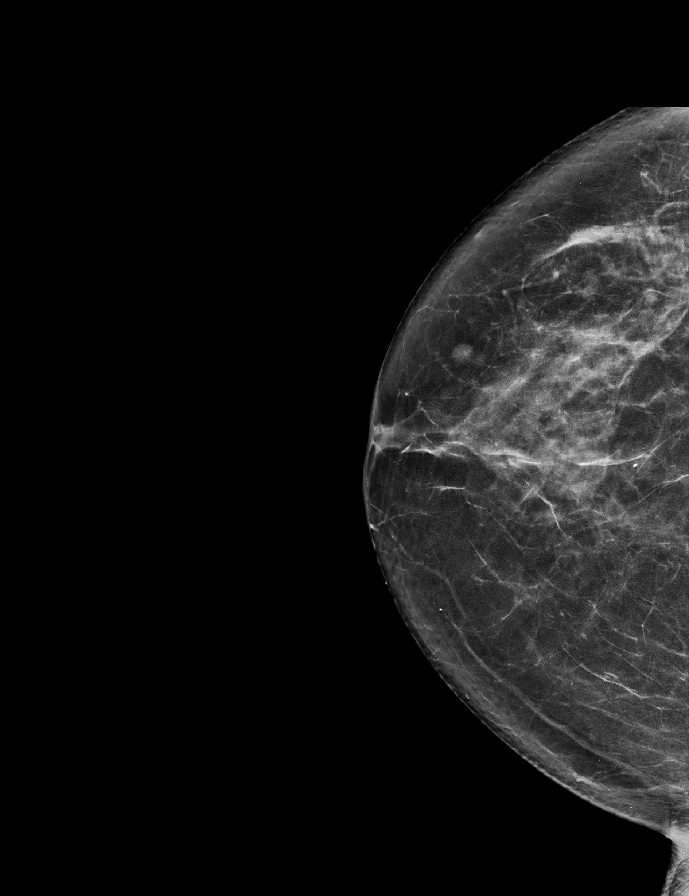

[R MLO synth-2D]
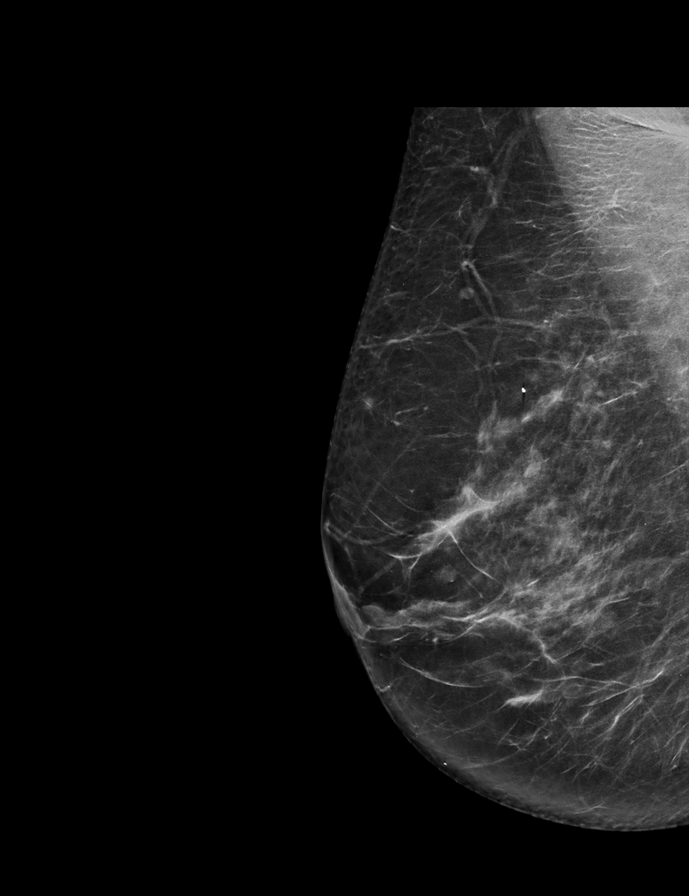

[L MLO synth-2D]
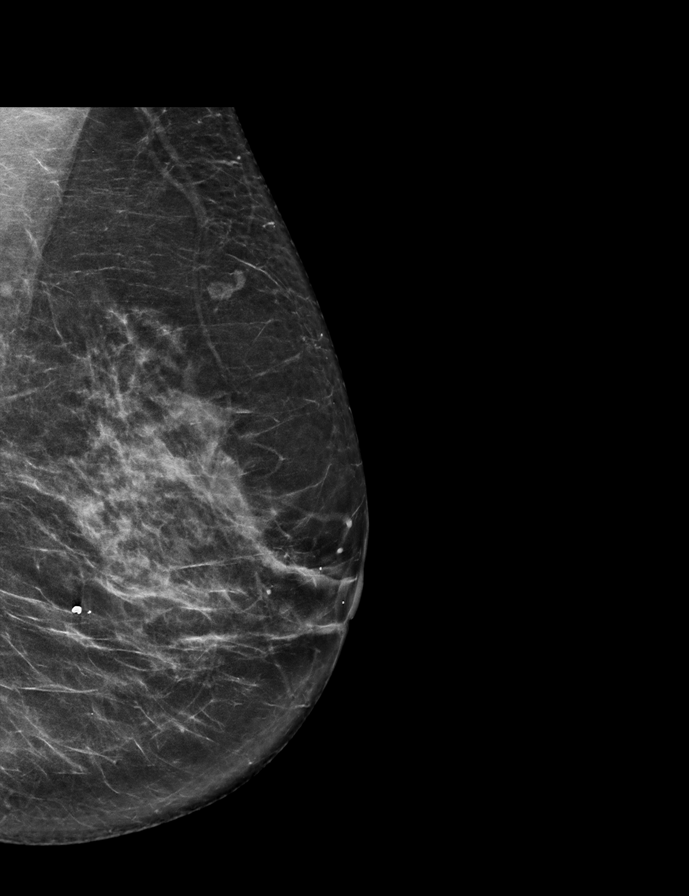

[L CC tomo · 2 of 70 frames shown]
[frame 23/70]
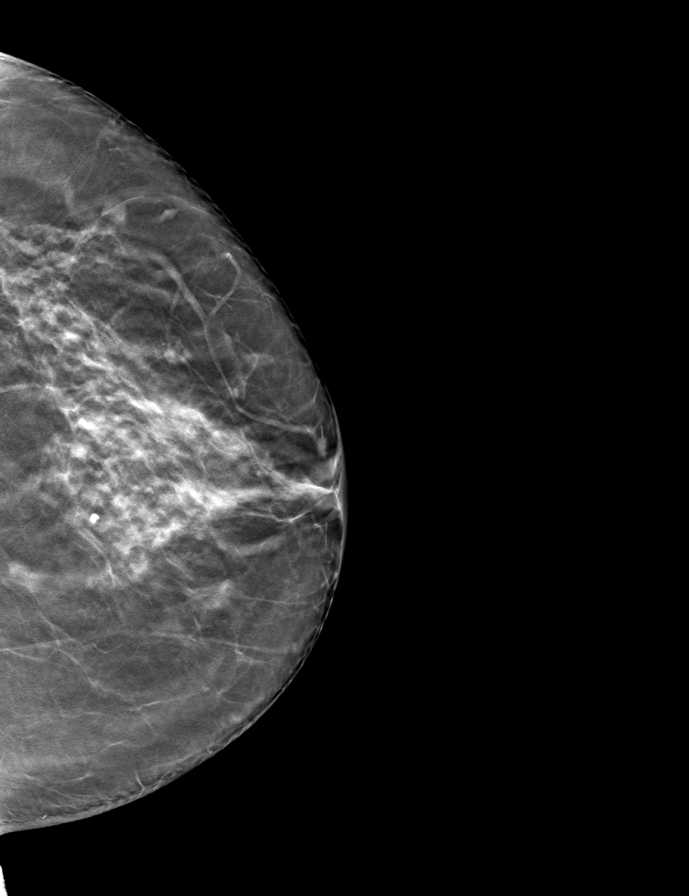
[frame 35/70]
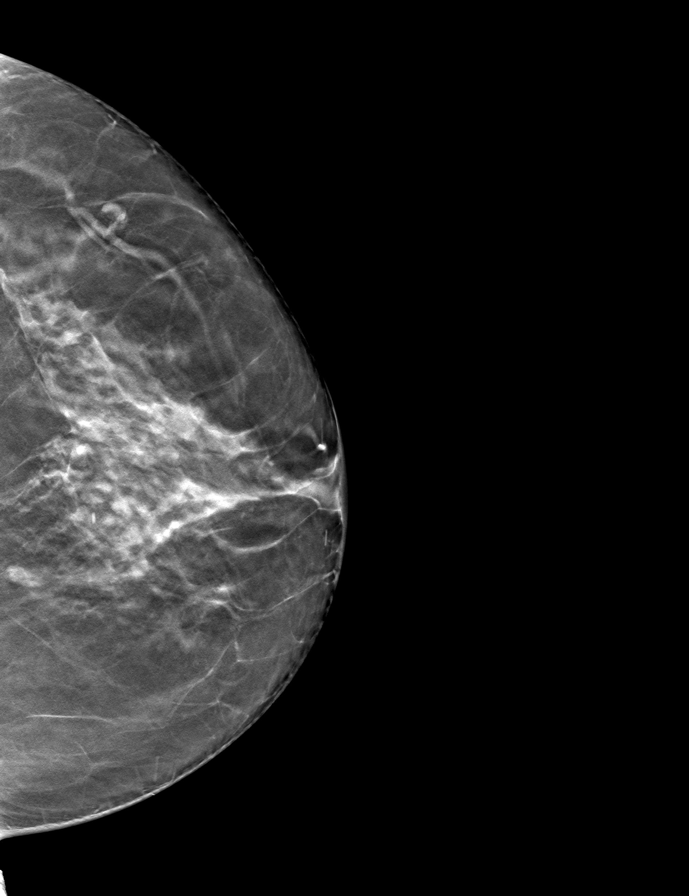

[R CC tomo · tomo slice 35/69.0]
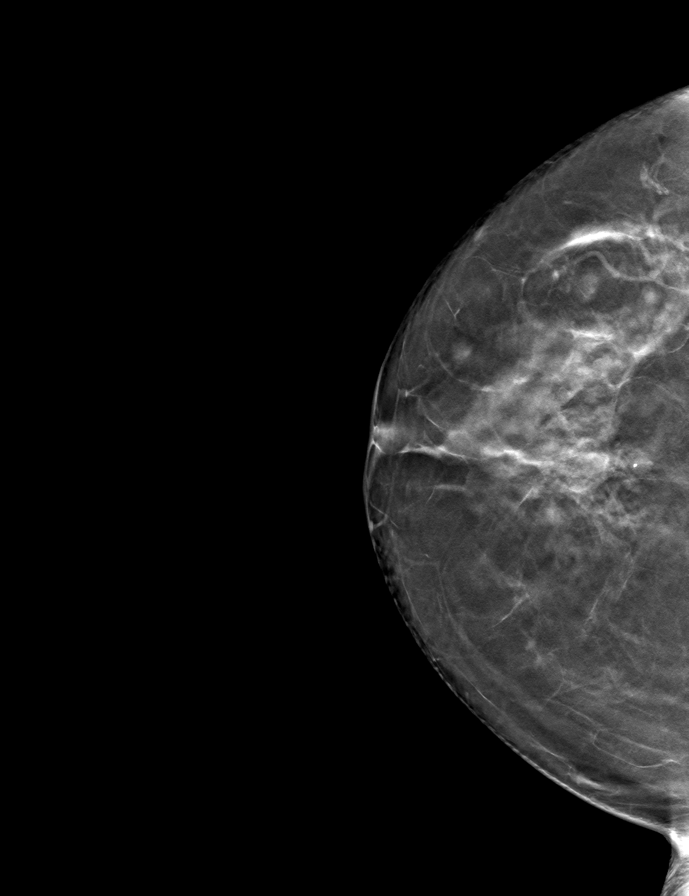

[R MLO tomo · tomo slice 36/71.0]
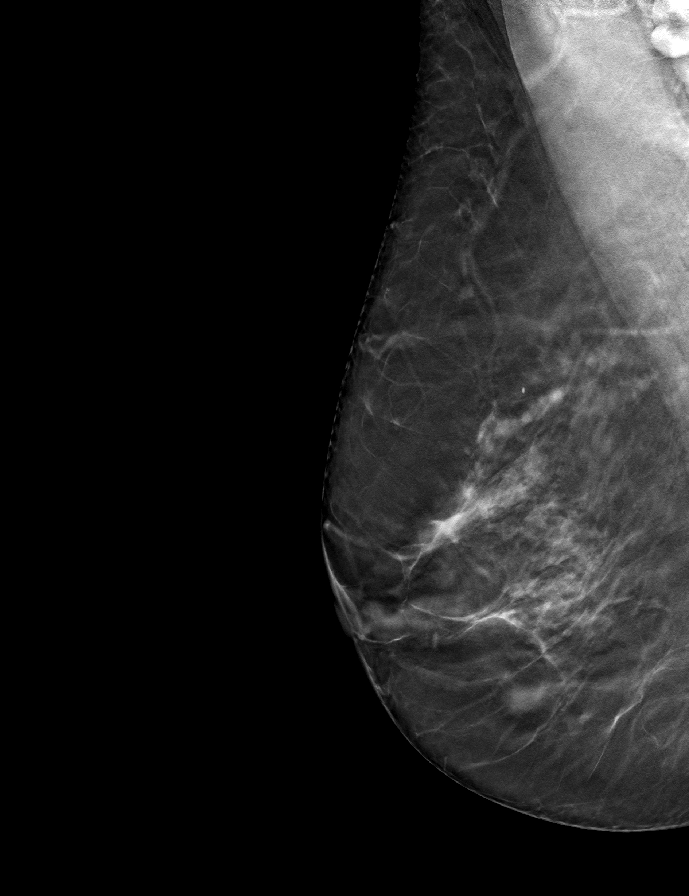

[L MLO tomo · tomo slice 35/70.0]
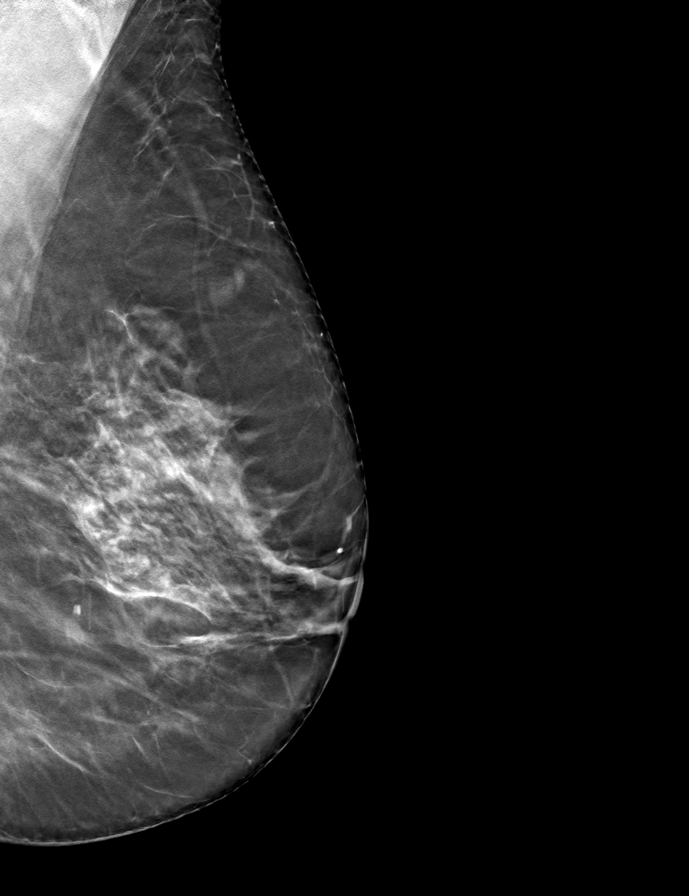

[9 of 24 positions shown; findings below may reference images not displayed]

ACR Breast Density Category c: The breast tissue is heterogeneously
dense, which may obscure small masses.
FINDINGS: There are no findings suspicious for malignancy. Images were
processed with CAD.
IMPRESSION: No mammographic evidence of malignancy. A result letter of this
screening mammogram will be mailed directly to the patient.

RECOMMENDATION:
Screening mammogram in one year. (Code:FT-U-LHB)

BI-RADS CATEGORY  1: Negative.

## 2020-05-18 MED ORDER — LEVOFLOXACIN 500 MG PO TABS: 500.0000 mg | ORAL_TABLET | Freq: Every day | ORAL | 0 refills | Status: DC

## 2020-05-19 ENCOUNTER — Encounter: Payer: Self-pay | Admitting: Podiatry

## 2020-05-19 ENCOUNTER — Other Ambulatory Visit: Payer: Self-pay | Admitting: Podiatry

## 2020-05-19 ENCOUNTER — Telehealth: Payer: Self-pay

## 2020-05-19 DIAGNOSIS — M2012 Hallux valgus (acquired), left foot: Secondary | ICD-10-CM

## 2020-05-19 NOTE — Progress Notes (Signed)
Subjective:  Patient ID: Melissa Brennan, female    DOB: [birth year],  MRN: [id number]  Chief Complaint  Patient presents with   Post-op Problem    urgent work in for dressing change     74 y.o. female returns for post-op check.  Patient is doing well.  She is doing great from the surgical site.  She is has good pain control however she is presenting again for left heel pressure injury that has been hurting her a lot.  She is not able to ambulate on it at all.  She denies any other acute complaints. Review of Systems: Negative except as noted in the HPI. Denies N/V/F/Ch.  Past Medical History:  Diagnosis Date   A-fib [facility])    Hypertension     Current Outpatient Medications:    acetaminophen (TYLENOL) 325 MG tablet, Take 650 mg by mouth every 6 (six) hours as needed for moderate pain or headache., Disp: , Rfl:    amiodarone (PACERONE) 100 MG tablet, Take 100 mg by mouth daily., Disp: , Rfl:    apixaban (ELIQUIS) 5 MG TABS tablet, Take 5 mg by mouth 2 (two) times daily., Disp: , Rfl:    Ascorbic Acid (VITAMIN C PO), Take 1 tablet by mouth daily., Disp: , Rfl:    Azilsartan-Chlorthalidone (EDARBYCLOR) 40-12.5 MG TABS, Take 0.5 tablets by mouth daily., Disp: , Rfl:    Cyanocobalamin (B-12 PO), Take 1 tablet by mouth daily., Disp: , Rfl:    doxycycline (VIBRA-TABS) 100 MG tablet, Take 1 tablet (100 mg total) by mouth 2 (two) times daily., Disp: 20 tablet, Rfl: 0   Ferrous Sulfate (IRON PO), Take 1 tablet by mouth daily., Disp: , Rfl:    ibuprofen (ADVIL) 800 MG tablet, Take 1 tablet (800 mg total) by mouth every 6 (six) hours as needed., Disp: 60 tablet, Rfl: 1   levofloxacin (LEVAQUIN) 500 MG tablet, Take 1 tablet (500 mg total) by mouth daily., Disp: 7 tablet, Rfl: 0   olmesartan (BENICAR) 5 MG tablet, Take by mouth daily., Disp: , Rfl:    omeprazole (PRILOSEC) 20 MG capsule, Take 20 mg by mouth daily., Disp: , Rfl:    rosuvastatin (CRESTOR) 10 MG tablet, Take 10 mg by  mouth every evening., Disp: , Rfl:   Social History   Tobacco Use  Smoking Status Never Smoker  Smokeless Tobacco Never Used    No Known Allergies Objective:  There were no vitals filed for this visit. There is no height or weight on file to calculate BMI. Constitutional Well developed. Well nourished.  Vascular Foot warm and well perfused. Capillary refill normal to all digits.   Neurologic Normal speech. Oriented to person, place, and time. Epicritic sensation to light touch grossly present bilaterally.  Dermatologic Skin healing well without signs of infection. Skin edges well coapted without signs of infection.  Orthopedic: Tenderness to palpation noted about the surgical site.  Pressure injury noted to the left heel likely due to dependent position as well as pistoning from the cam boot.  No open wound or ulceration noted.   Radiographs: 3 views of skeletally mature adult left foot: Good correction alignment noted.  Hardware is intact without any signs of blackening or loosening. Assessment:   1. Status post foot surgery   2. Hav (hallux abducto valgus), left   3. Blister of left heel, initial encounter    Plan:  Patient was evaluated and treated and all questions answered.  S/p foot surgery left -Progressing as expected post-operatively. -XR:  See above -WB Status: Ideally I would have like patient to be in a surgical shoe however given the posterior pressure injury patient has self transition into regular flat shoes. -Sutures: Were removed. No signs of dehiscence noted. No clinical signs of infection noted. -Medications: None -Foot redressed.  Left heel pressure injury -There was a blister formation after undergoing a left pressure heel injury.  Given the amount of pain that she is having I believe patient will benefit from draining the blister.  Using 18-gauge needle the blister was drained in standard technique.  No underlying ulceration noted.  Raw skin noted.   Xeroform followed by dressing was used to apply and dressed the foot.  No follow-ups on file.

## 2020-05-19 NOTE — Telephone Encounter (Signed)
Pt called and stated the her cardiology stated that she couldn't take the Levaquin prescribed due to pt being on amiodarone and eliquis.  Pt would like another medication called in please.

## 2020-05-19 NOTE — Telephone Encounter (Signed)
Sending to you, I saw her yesterday and she had some redness and swelling still. She got lab work by her PCP, we tried to get results but was not available. I was going to add Levo to the Doxy but cardiology objects. May want to switch to augmentin or bactrim for better gram - / anaerobe / strep coverage.  FWIW I don't think she really has a severe infection, I think most of the redness and swelling is from bone healing

## 2020-05-20 ENCOUNTER — Telehealth: Payer: Self-pay | Admitting: Podiatrist

## 2020-05-20 ENCOUNTER — Other Ambulatory Visit: Payer: Self-pay | Admitting: Podiatrist

## 2020-05-20 MED ORDER — SULFAMETHOXAZOLE-TRIMETHOPRIM 800-160 MG PO TABS: 1.0000 | ORAL_TABLET | Freq: Two times a day (BID) | ORAL | 1 refills | Status: DC

## 2020-05-20 NOTE — Telephone Encounter (Signed)
Patient called this am stating she needs a different antibiotic as the Amoxicillin isn't working and her cardiologist advised against the other antibiotic given.  I spoke with Dr. Posey Pronto and called in Bactrim DS 1 tab po bid #20 to her pharmacy [facility].  She will call back if she has any other questions or concerns about the antibiotic.

## 2020-05-20 NOTE — Progress Notes (Signed)
  Subjective:  Patient ID: Melissa Brennan, female    DOB: 11-08-1945,  MRN: [id number]  Chief Complaint  Patient presents with  . Foot Problem    Pt states infection in left foot. Pt is currently taking Doxycycline. There is warmth redness and minor swelling present. Pt states general malaise.    74 y.o. female presents with the above complaint. History confirmed with patient.  She underwent bunion surgery on 04/29/2020 with Dr. Posey Pronto.  She developed abrasions and a pressure injury blister on the heel secondary to the cam boot.  She presents today ambulating in open back shoes.  She states her foot has been red and swollen, and that she has felt very tired.  Denies any contacts or exposures to COVID-19.  Dr. Posey Pronto gave her a prescription for doxycycline last Friday and she has been taking this.  Her PCP sent her for lab work today.  Objective:  Physical Exam: warm, good capillary refill, no trophic changes or ulcerative lesions, normal DP and PT pulses and normal sensory exam. Left Foot: Scar from incision remains well coapted, there is periincisional erythema, warmth and edema.  No drainage noted.  No open wounds.  Mild tenderness about the surgical site.  Abrasions on the anterior ankle and pressure injury left heel appear to be healing well  Radiographs: X-ray of the left foot: Consistent with postoperative changes and similar to appearance to previous radiographs taken by Dr. Posey Pronto Assessment:   1. Pressure injury of left heel, stage 1      Plan:  Patient was evaluated and treated and all questions answered.  -She had lab work done at [facility] by her PCP today, we attempted to obtain the results of this and were unsuccessful.  I asked her to talk to her PCP and have him forward the results to [place].  She discussed if she could have further antibiotics beyond the doxycycline or if she needed this, I recommended we begin Levaquin to supplement and cover for possible gram-negative exposure.  I do  think that the gram-negative infection would be unlikely in a healthy patient such as her from a surgical wound.  After prescribing this, ice received message from her and her pharmacist and cardiologist no prefer not to take the Levaquin secondary to her amiodarone and Eliquis use and interactions and because of this.  I think this is reasonable and I will forward her encounter to Dr. Posey Pronto for further management if he needs to see her this week.  It is possible that this is a suture reaction granuloma that she is having or secondary to bone healing.  She has felt tired, but has not had any fevers or chills and her temperature is normal today.  No follow-ups on file.

## 2020-05-27 ENCOUNTER — Other Ambulatory Visit: Payer: Self-pay

## 2020-05-27 ENCOUNTER — Encounter: Payer: Self-pay | Admitting: Podiatry

## 2020-05-27 ENCOUNTER — Ambulatory Visit (INDEPENDENT_AMBULATORY_CARE_PROVIDER_SITE_OTHER): Payer: Medicare Other

## 2020-05-27 ENCOUNTER — Ambulatory Visit (INDEPENDENT_AMBULATORY_CARE_PROVIDER_SITE_OTHER): Payer: Medicare Other | Admitting: Podiatry

## 2020-05-27 DIAGNOSIS — Z9889 Other specified postprocedural states: Secondary | ICD-10-CM

## 2020-05-27 DIAGNOSIS — L89621 Pressure ulcer of left heel, stage 1: Secondary | ICD-10-CM | POA: Diagnosis not present

## 2020-05-27 DIAGNOSIS — M2012 Hallux valgus (acquired), left foot: Secondary | ICD-10-CM

## 2020-05-27 NOTE — Progress Notes (Signed)
Subjective:  Patient ID: Melissa Brennan, female    DOB: [birth year],  MRN: [id number]  Chief Complaint  Patient presents with  . Routine Post Op    POV #3 DOS 04/29/20 LT CORRECTION OF BUNION W/PHALANX OSTEOTOMY     74 y.o. female returns for post-op check.  Patient is doing well.  She is doing great from the surgical site.  She is has good pain control however she is presenting again for left heel pressure injury that has been hurting her a lot.  She is not able to ambulate on it at all.  She denies any other acute complaints. Review of Systems: Negative except as noted in the HPI. Denies N/V/F/Ch.  Past Medical History:  Diagnosis Date  . A-fib ([place])   . Hypertension     Current Outpatient Medications:  .  acetaminophen (TYLENOL) 325 MG tablet, Take 650 mg by mouth every 6 (six) hours as needed for moderate pain or headache., Disp: , Rfl:  .  amiodarone (PACERONE) 100 MG tablet, Take 100 mg by mouth daily., Disp: , Rfl:  .  apixaban (ELIQUIS) 5 MG TABS tablet, Take 5 mg by mouth 2 (two) times daily., Disp: , Rfl:  .  Ascorbic Acid (VITAMIN C PO), Take 1 tablet by mouth daily., Disp: , Rfl:  .  Azilsartan-Chlorthalidone (EDARBYCLOR) 40-12.5 MG TABS, Take 0.5 tablets by mouth daily., Disp: , Rfl:  .  Cyanocobalamin (B-12 PO), Take 1 tablet by mouth daily., Disp: , Rfl:  .  doxycycline (VIBRA-TABS) 100 MG tablet, Take 1 tablet (100 mg total) by mouth 2 (two) times daily., Disp: 20 tablet, Rfl: 0 .  Ferrous Sulfate (IRON PO), Take 1 tablet by mouth daily., Disp: , Rfl:  .  ibuprofen (ADVIL) 800 MG tablet, Take 1 tablet (800 mg total) by mouth every 6 (six) hours as needed., Disp: 60 tablet, Rfl: 1 .  levofloxacin (LEVAQUIN) 500 MG tablet, Take 1 tablet (500 mg total) by mouth daily., Disp: 7 tablet, Rfl: 0 .  olmesartan (BENICAR) 5 MG tablet, Take by mouth daily., Disp: , Rfl:  .  omeprazole (PRILOSEC) 20 MG capsule, Take 20 mg by mouth daily., Disp: , Rfl:  .  rosuvastatin (CRESTOR) 10 MG  tablet, Take 10 mg by mouth every evening., Disp: , Rfl:  .  sulfamethoxazole-trimethoprim (BACTRIM DS) 800-160 MG tablet, Take 1 tablet by mouth 2 (two) times daily., Disp: 20 tablet, Rfl: 1  Social History   Tobacco Use  Smoking Status Never Smoker  Smokeless Tobacco Never Used    No Known Allergies Objective:  There were no vitals filed for this visit. There is no height or weight on file to calculate BMI. Constitutional Well developed. Well nourished.  Vascular Foot warm and well perfused. Capillary refill normal to all digits.   Neurologic Normal speech. Oriented to person, place, and time. Epicritic sensation to light touch grossly present bilaterally.  Dermatologic Skin healing well without signs of infection. Skin edges well coapted without signs of infection.  Orthopedic: Tenderness to palpation noted about the surgical site.  Pressure injury noted to the left heel likely due to dependent position as well as pistoning from the cam boot.  No open wound or ulceration noted.   Radiographs: 3 views of skeletally mature adult left foot: Shifting of the capital fragment noted.  Hardware is intact however the proximal screw may be loosening. Assessment:   1. Pressure injury of left heel, stage 1   2. Hav (hallux abducto valgus), left  3. Status post foot surgery    Plan:  Patient was evaluated and treated and all questions answered.  S/p foot surgery left -Progressing as expected post-operatively. -XR: See above -WB Status: Ideally I would have like patient to be in a surgical shoe however given the posterior pressure injury patient has self transition into regular flat shoes. -Sutures: None -Medications: She has completed her doxycycline course. -X-rays were reviewed with the patient.  It appears that the capital fragment has shifted a little bit likely due to her ambulating too early.  The proximal screw may not be holding however is still well tolerated within the  bone.  I discussed with the patient that in the future if it starts hurting it we can remove it.  Patient states understanding.  At this time patient would like to hold off on any further surgeries.  Left heel pressure injury -There was a blister formation after undergoing a left pressure heel injury.  Given the amount of pain that she is having I believe patient will benefit from draining the blister.  Using 18-gauge needle the blister was drained in standard technique.  No underlying ulceration noted.  Raw skin noted.  Xeroform followed by dressing was used to apply and dressed the foot.  No follow-ups on file.

## 2020-06-26 ENCOUNTER — Ambulatory Visit (INDEPENDENT_AMBULATORY_CARE_PROVIDER_SITE_OTHER): Payer: Medicare Other | Admitting: Podiatry

## 2020-06-26 ENCOUNTER — Other Ambulatory Visit: Payer: Self-pay

## 2020-06-26 DIAGNOSIS — R202 Paresthesia of skin: Secondary | ICD-10-CM | POA: Diagnosis not present

## 2020-06-26 DIAGNOSIS — R200 Anesthesia of skin: Secondary | ICD-10-CM | POA: Diagnosis not present

## 2020-06-26 DIAGNOSIS — M2580 Other specified joint disorders, unspecified joint: Secondary | ICD-10-CM | POA: Diagnosis not present

## 2020-07-01 ENCOUNTER — Encounter: Payer: Medicare Other | Admitting: Podiatry

## 2020-07-01 ENCOUNTER — Encounter: Payer: Self-pay | Admitting: Podiatry

## 2020-07-01 NOTE — Progress Notes (Addendum)
Subjective:  Patient ID: Melissa Brennan, female    DOB: 1945-12-13,  MRN: [id number]  Chief Complaint  Patient presents with   Routine Post Op    POV #4 DOS 04/29/20 LT CORRECTION OF BUNION W/PHALANX OSTEOTOMY    74 y.o. female presents with the above complaint.  Patient presents with complaint of pain to the left submetatarsal 1.  Patient states that the pain appears to be more on the plantar aspect right under the sesamoidal complex.  The bunion surgery site is doing well.  Has completely healed.  However she still has pain to the submetatarsal 1.  She denies any other acute complaints.  She would like to know if there is any advanced imaging that could be done for this.  Patient states understanding.   Review of Systems: Negative except as noted in the HPI. Denies N/V/F/Ch.  Past Medical History:  Diagnosis Date   A-fib [facility])    Hypertension     Current Outpatient Medications:    acetaminophen (TYLENOL) 325 MG tablet, Take 650 mg by mouth every 6 (six) hours as needed for moderate pain or headache., Disp: , Rfl:    amiodarone (PACERONE) 100 MG tablet, Take 100 mg by mouth daily., Disp: , Rfl:    apixaban (ELIQUIS) 5 MG TABS tablet, Take 5 mg by mouth 2 (two) times daily., Disp: , Rfl:    Ascorbic Acid (VITAMIN C PO), Take 1 tablet by mouth daily., Disp: , Rfl:    Azilsartan-Chlorthalidone (EDARBYCLOR) 40-12.5 MG TABS, Take 0.5 tablets by mouth daily., Disp: , Rfl:    Cyanocobalamin (B-12 PO), Take 1 tablet by mouth daily., Disp: , Rfl:    doxycycline (VIBRA-TABS) 100 MG tablet, Take 1 tablet (100 mg total) by mouth 2 (two) times daily., Disp: 20 tablet, Rfl: 0   Ferrous Sulfate (IRON PO), Take 1 tablet by mouth daily., Disp: , Rfl:    ibuprofen (ADVIL) 800 MG tablet, Take 1 tablet (800 mg total) by mouth every 6 (six) hours as needed., Disp: 60 tablet, Rfl: 1   levofloxacin (LEVAQUIN) 500 MG tablet, Take 1 tablet (500 mg total) by mouth daily., Disp: 7 tablet, Rfl: 0    olmesartan (BENICAR) 5 MG tablet, Take by mouth daily., Disp: , Rfl:    omeprazole (PRILOSEC) 20 MG capsule, Take 20 mg by mouth daily., Disp: , Rfl:    rosuvastatin (CRESTOR) 10 MG tablet, Take 10 mg by mouth every evening., Disp: , Rfl:    sulfamethoxazole-trimethoprim (BACTRIM DS) 800-160 MG tablet, Take 1 tablet by mouth 2 (two) times daily., Disp: 20 tablet, Rfl: 1  Social History   Tobacco Use  Smoking Status Never Smoker  Smokeless Tobacco Never Used    No Known Allergies Objective:  There were no vitals filed for this visit. There is no height or weight on file to calculate BMI. Constitutional Well developed. Well nourished.  Vascular Dorsalis pedis pulses palpable bilaterally. Posterior tibial pulses palpable bilaterally. Capillary refill normal to all digits.  No cyanosis or clubbing noted. Pedal hair growth normal.  Neurologic Normal speech. Oriented to person, place, and time. Epicritic sensation to light touch grossly present bilaterally.  Dermatologic Nails well groomed and normal in appearance. No open wounds. No skin lesions.  Orthopedic:  Pain on palpation submetatarsal 1 at the sesamoidal complex.  No pain with range of motion of the first metatarsophalangeal joint.  Previous bunion scar has healed adequately.  Posterior heel pressure site is also healed well.   Radiographs: None Assessment:  1. Sesamoiditis   2. Numbness and tingling    Plan:  Patient was evaluated and treated and all questions answered.  Status post bunionectomy to the left side -Patient is clinically doing well.  The skin incision has reepithelialized. -I will begin physical therapy to return the range of motion.  She will be scheduled for physical therapy.  Left sesamoiditis versus neuroma -I explained the patient the etiology of sesamoiditis and various treatment options were extensively discussed.  I believe patient will benefit from an MRI evaluation given that patient has  failed dorsiflexor he has an elevation osteotomy with correction of bunion.  I am hoping that the MRI will give [place] more of a soft tissue involvement to see if there is anything else is going on. -Patient will be scheduled for left foot MRI  Numbness and tingling to the left hallux/second toe -I explained patient the etiology of numbness and tingling and is likely due to the healing process.  However patient states that she would possibly benefit from a nerve conduction study to make sure that there is no impingement present. -Should be scheduled for nerve conduction study  No follow-ups on file.

## 2020-07-05 ENCOUNTER — Ambulatory Visit
Admission: RE | Admit: 2020-07-05 | Discharge: 2020-07-05 | Disposition: A | Discharge status: Home / Self Care | Payer: Medicare Other | Source: Ambulatory Visit | Attending: Podiatry | Admitting: Podiatry

## 2020-07-05 DIAGNOSIS — M2580 Other specified joint disorders, unspecified joint: Secondary | ICD-10-CM

## 2020-07-10 ENCOUNTER — Telehealth: Payer: Self-pay | Admitting: Urology

## 2020-07-10 NOTE — Telephone Encounter (Signed)
Pt would like a call with results from MRI.

## 2020-07-13 ENCOUNTER — Telehealth: Payer: Self-pay | Admitting: Podiatry

## 2020-07-13 NOTE — Telephone Encounter (Signed)
Contacted patient for MRI results. Left a voicemail instructed her to call the office back.

## 2020-07-16 ENCOUNTER — Ambulatory Visit (INDEPENDENT_AMBULATORY_CARE_PROVIDER_SITE_OTHER): Payer: Medicare Other | Admitting: Podiatry

## 2020-07-16 ENCOUNTER — Encounter: Payer: Self-pay | Admitting: Podiatry

## 2020-07-16 ENCOUNTER — Other Ambulatory Visit: Payer: Self-pay

## 2020-07-16 DIAGNOSIS — R200 Anesthesia of skin: Secondary | ICD-10-CM | POA: Diagnosis not present

## 2020-07-16 DIAGNOSIS — R202 Paresthesia of skin: Secondary | ICD-10-CM | POA: Diagnosis not present

## 2020-07-16 DIAGNOSIS — M2580 Other specified joint disorders, unspecified joint: Secondary | ICD-10-CM | POA: Diagnosis not present

## 2020-07-17 NOTE — Progress Notes (Signed)
Subjective:  Patient ID: Melissa Brennan, female    DOB: [birth year],  MRN: [id number]  Chief Complaint  Patient presents with  . Results    mri results     74 y.o. female presents with the above complaint.  Patient presents with complaint of pain to the left submetatarsal 1.  Patient states that the pain appears to be more on the plantar aspect right under the sesamoidal complex.  The bunion surgery site is doing well.  Has completely healed.  However she still has pain to the submetatarsal 1.  She denies any other acute complaints. She has tried some various shoe gear modification which has helped a lot. She is also here to undergo MRI.   Review of Systems: Negative except as noted in the HPI. Denies N/V/F/Ch.  Past Medical History:  Diagnosis Date  . A-fib ([place])   . Hypertension     Current Outpatient Medications:  .  acetaminophen (TYLENOL) 325 MG tablet, Take 650 mg by mouth every 6 (six) hours as needed for moderate pain or headache., Disp: , Rfl:  .  amiodarone (PACERONE) 100 MG tablet, Take 100 mg by mouth daily., Disp: , Rfl:  .  apixaban (ELIQUIS) 5 MG TABS tablet, Take 5 mg by mouth 2 (two) times daily., Disp: , Rfl:  .  Ascorbic Acid (VITAMIN C PO), Take 1 tablet by mouth daily., Disp: , Rfl:  .  Azilsartan-Chlorthalidone (EDARBYCLOR) 40-12.5 MG TABS, Take 0.5 tablets by mouth daily., Disp: , Rfl:  .  Cyanocobalamin (B-12 PO), Take 1 tablet by mouth daily., Disp: , Rfl:  .  doxycycline (VIBRA-TABS) 100 MG tablet, Take 1 tablet (100 mg total) by mouth 2 (two) times daily., Disp: 20 tablet, Rfl: 0 .  Ferrous Sulfate (IRON PO), Take 1 tablet by mouth daily., Disp: , Rfl:  .  ibuprofen (ADVIL) 800 MG tablet, Take 1 tablet (800 mg total) by mouth every 6 (six) hours as needed., Disp: 60 tablet, Rfl: 1 .  levofloxacin (LEVAQUIN) 500 MG tablet, Take 1 tablet (500 mg total) by mouth daily., Disp: 7 tablet, Rfl: 0 .  olmesartan (BENICAR) 5 MG tablet, Take by mouth daily., Disp: , Rfl:   .  omeprazole (PRILOSEC) 20 MG capsule, Take 20 mg by mouth daily., Disp: , Rfl:  .  rosuvastatin (CRESTOR) 10 MG tablet, Take 10 mg by mouth every evening., Disp: , Rfl:  .  sulfamethoxazole-trimethoprim (BACTRIM DS) 800-160 MG tablet, Take 1 tablet by mouth 2 (two) times daily., Disp: 20 tablet, Rfl: 1  Social History   Tobacco Use  Smoking Status Never Smoker  Smokeless Tobacco Never Used    No Known Allergies Objective:  There were no vitals filed for this visit. There is no height or weight on file to calculate BMI. Constitutional Well developed. Well nourished.  Vascular Dorsalis pedis pulses palpable bilaterally. Posterior tibial pulses palpable bilaterally. Capillary refill normal to all digits.  No cyanosis or clubbing noted. Pedal hair growth normal.  Neurologic Normal speech. Oriented to person, place, and time. Epicritic sensation to light touch grossly present bilaterally.  Dermatologic Nails well groomed and normal in appearance. No open wounds. No skin lesions.  Orthopedic:  Pain on palpation submetatarsal 1 at the sesamoidal complex.  No pain with range of motion of the first metatarsophalangeal joint.  Previous bunion scar has healed adequately.  Posterior heel pressure site is also healed well.   Radiographs: None Assessment:   1. Sesamoiditis   2. Numbness and tingling  Plan:  Patient was evaluated and treated and all questions answered.  Status post bunionectomy to the left side -Patient is clinically doing well.  The skin incision has reepithelialized. -I will begin physical therapy to return the range of motion. She can continue physical therapy for as long is helping her and she will discontinue physical therapy as needed..  Left sesamoiditis versus neuroma -I explained the patient the etiology of sesamoiditis and various treatment options were extensively discussed.  -MRI was reviewed with the patient in extensive detail which did not show any  involvement of sesamoid complex. MRI was also negative for any other soft tissue injury as well. -I also believe that patient is putting excessive pressure secondary to some plantar fat pad atrophy ultimately I discussed with the patient shoe gear modification which seems to be helping with the pain.  Numbness and tingling to the left hallux/second toe -I explained patient the etiology of numbness and tingling and is likely due to the healing process.  However patient states that she would possibly benefit from a nerve conduction study to make sure that there is no impingement present. -Should be scheduled for nerve conduction study  No follow-ups on file.

## 2020-08-03 ENCOUNTER — Other Ambulatory Visit: Payer: Self-pay | Admitting: Obstetrics and Gynecology

## 2020-08-03 DIAGNOSIS — Z1231 Encounter for screening mammogram for malignant neoplasm of breast: Secondary | ICD-10-CM

## 2020-09-10 ENCOUNTER — Ambulatory Visit
Admission: RE | Admit: 2020-09-10 | Discharge: 2020-09-10 | Disposition: A | Discharge status: Home / Self Care | Payer: Medicare Other | Source: Ambulatory Visit | Attending: Obstetrics and Gynecology | Admitting: Obstetrics and Gynecology

## 2020-09-10 ENCOUNTER — Other Ambulatory Visit: Payer: Self-pay

## 2020-09-10 DIAGNOSIS — Z1231 Encounter for screening mammogram for malignant neoplasm of breast: Secondary | ICD-10-CM

## 2020-09-11 ENCOUNTER — Ambulatory Visit: Payer: Medicare Other

## 2020-09-15 ENCOUNTER — Ambulatory Visit (INDEPENDENT_AMBULATORY_CARE_PROVIDER_SITE_OTHER): Payer: Medicare Other

## 2020-09-15 ENCOUNTER — Ambulatory Visit (INDEPENDENT_AMBULATORY_CARE_PROVIDER_SITE_OTHER): Payer: Medicare Other | Admitting: Podiatry

## 2020-09-15 ENCOUNTER — Other Ambulatory Visit: Payer: Self-pay

## 2020-09-15 DIAGNOSIS — M2012 Hallux valgus (acquired), left foot: Secondary | ICD-10-CM

## 2020-09-15 DIAGNOSIS — M2580 Other specified joint disorders, unspecified joint: Secondary | ICD-10-CM

## 2020-09-16 ENCOUNTER — Encounter: Payer: Self-pay | Admitting: Podiatry

## 2020-09-16 NOTE — Progress Notes (Signed)
Review  Subjective:  Patient ID: Melissa Brennan, female    DOB: 07/29/1946,  MRN: [id number]  Chief Complaint  Patient presents with  . Bunions    Pt stated that her bunion is still present and she is having a lot of pain with that area. She can only wear soft shoes.    74 y.o. female presents with the above complaint.  Patient presents with a complaint of pain to the left bunion deformity.  Patient states that bunion may be coming back.  She had a previous bunion surgery done by me last year.  However she started ambulating without immobilization which may have led to shifting of the capital fragment as we saw on an x-ray.  However at that time patient did not want to undergo revisional surgery.  She wanted to discuss what the next option is still continues to hurt her and it seems like the bunion may be coming back.  She denies any other acute complaints.   Review of Systems: Negative except as noted in the HPI. Denies N/V/F/Ch.  Past Medical History:  Diagnosis Date  . A-fib (HCC)   . Hypertension     Current Outpatient Medications:  .  acetaminophen (TYLENOL) 325 MG tablet, Take 650 mg by mouth every 6 (six) hours as needed for moderate pain or headache., Disp: , Rfl:  .  amiodarone (PACERONE) 100 MG tablet, Take 100 mg by mouth daily., Disp: , Rfl:  .  apixaban (ELIQUIS) 5 MG TABS tablet, Take 5 mg by mouth 2 (two) times daily., Disp: , Rfl:  .  Ascorbic Acid (VITAMIN C PO), Take 1 tablet by mouth daily., Disp: , Rfl:  .  Azilsartan-Chlorthalidone (EDARBYCLOR) 40-12.5 MG TABS, Take 0.5 tablets by mouth daily., Disp: , Rfl:  .  Cyanocobalamin (B-12 PO), Take 1 tablet by mouth daily., Disp: , Rfl:  .  doxycycline (VIBRA-TABS) 100 MG tablet, Take 1 tablet (100 mg total) by mouth 2 (two) times daily., Disp: 20 tablet, Rfl: 0 .  Ferrous Sulfate (IRON PO), Take 1 tablet by mouth daily., Disp: , Rfl:  .  ibuprofen (ADVIL) 800 MG tablet, Take 1 tablet (800 mg total) by mouth every 6 (six)  hours as needed., Disp: 60 tablet, Rfl: 1 .  levofloxacin (LEVAQUIN) 500 MG tablet, Take 1 tablet (500 mg total) by mouth daily., Disp: 7 tablet, Rfl: 0 .  olmesartan (BENICAR) 5 MG tablet, Take by mouth daily., Disp: , Rfl:  .  omeprazole (PRILOSEC) 20 MG capsule, Take 20 mg by mouth daily., Disp: , Rfl:  .  rosuvastatin (CRESTOR) 10 MG tablet, Take 10 mg by mouth every evening., Disp: , Rfl:  .  sulfamethoxazole-trimethoprim (BACTRIM DS) 800-160 MG tablet, Take 1 tablet by mouth 2 (two) times daily., Disp: 20 tablet, Rfl: 1  Social History   Tobacco Use  Smoking Status Never Smoker  Smokeless Tobacco Never Used    No Known Allergies Objective:  There were no vitals filed for this visit. There is no height or weight on file to calculate BMI. Constitutional Well developed. Well nourished.  Vascular Dorsalis pedis pulses palpable bilaterally. Posterior tibial pulses palpable bilaterally. Capillary refill normal to all digits.  No cyanosis or clubbing noted. Pedal hair growth normal.  Neurologic Normal speech. Oriented to person, place, and time. Epicritic sensation to light touch grossly present bilaterally.  Dermatologic Nails well groomed and normal in appearance. No open wounds. No skin lesions.  Orthopedic:  Pain on palpation to the medial prominence.  Pain with range of motion of the metatarsophalangeal joint.  No intra-articular pain noted.  Pain along the incision site as well.  No prominent screw noted.   Radiographs: 3 views of skeletally mature adult left foot: The x-ray shows failure of the proximal screw leading to rotation/angulation of the capital fragment leading to recurrence of the bunion deformity. Assessment:   1. Sesamoiditis   2. Hav (hallux abducto valgus), left    Plan:  Patient was evaluated and treated and all questions answered.  Left recurrence of bunion deformity secondary to shifting of the capital fragment -I explained to the patient the  etiology of bunion deformity and various treatment options were discussed.  I discussed with her that we may have to provisionally fix the bunion and revise the correction in order to give her the relief that she is looking for including recurrence of the bunion deformity.  I discussed with her that in extensive detail.  Patient would like to think about the surgery as she is going to [place] for next few months I would do a rediscuss surgical treatment options after she returns. -I discussed with the patient that she is a high risk of developing arthritis in the metatarsophalangeal joint which can lead to fusion of the joint if not addressed very soon.  Patient states understanding would like to think about the procedure and hold off for now.  No follow-ups on file.

## 2020-11-17 ENCOUNTER — Ambulatory Visit (INDEPENDENT_AMBULATORY_CARE_PROVIDER_SITE_OTHER): Payer: Medicare Other | Admitting: Podiatry

## 2020-11-17 ENCOUNTER — Other Ambulatory Visit: Payer: Self-pay

## 2020-11-17 ENCOUNTER — Encounter: Payer: Self-pay | Admitting: Podiatry

## 2020-11-17 DIAGNOSIS — L905 Scar conditions and fibrosis of skin: Secondary | ICD-10-CM

## 2020-11-17 DIAGNOSIS — R52 Pain, unspecified: Secondary | ICD-10-CM

## 2020-11-17 NOTE — Progress Notes (Signed)
She presents today after having seen Dr. Posey Pronto on several occasions for surgery that was performed back sometime ago June or August of last year.  She had a bunion procedure performed for he performed an Futures trader with 2 screw fixation with Herbert screws as well as a staple for an Aiken osteotomy.  She will not went on to heal uneventfully however she still has pain about the joint.  She states that there was no trauma postoperatively other than the trauma the boot caused when her skin breakdown to her heel as well as her anterior ankle.  Objective: Vital signs are stable she is alert oriented x3 pulses are palpable.  She has pain on range of motion of the first metatarsophalangeal joint of the left foot with a palpable mass to the medial aspect.  She also has tenderness on palpation of the sesamoids.  Pulses remain strong and palpable.  Neurologic sensorium is intact and hypersensitive plantar lateral first metatarsophalangeal joint.  There are deep tendon reflexes are intact muscle strength is normal and symmetrical.  Orthopedic evaluation demonstrates all joints distal to the ankle full range of motion without crepitation with exception of limited range of motion of the sesamoids as well as the first metatarsophalangeal joint.  Radiographs taken today demonstrate a medial collapse of the head of the capital osteotomy most likely due to osteoporosis or trauma.  It was rectus postoperatively and the first postop visit.  It appears that the distal screw may be interfering with the sesamoids and this is definitely a possibility of the hypertrophic bone that she feels medially is actually the head of the bone that is tipped.  This is resulting in more of a lateral position of the hallux itself.   Assessment: Austin bunionectomy that failed resulting in chronic pain.  Plan: At this point I recommended that she wait until July or August before doing anything giving this 1 year to completely heal.   She understands and is amenable to it.  I discussed possible complications associated with another surgery she understands and is amenable to it.  I will follow-up with her in August at which time removal of internal fixation may be all she wants or may need to perform another capital osteotomy just to repair the position of the toe.  She understands this is amenable to it we will follow-up with me at that time

## 2020-11-26 ENCOUNTER — Ambulatory Visit: Payer: Medicare Other | Admitting: Surgery

## 2020-12-03 ENCOUNTER — Ambulatory Visit (INDEPENDENT_AMBULATORY_CARE_PROVIDER_SITE_OTHER): Payer: Medicare Other

## 2020-12-03 ENCOUNTER — Ambulatory Visit (INDEPENDENT_AMBULATORY_CARE_PROVIDER_SITE_OTHER): Payer: Medicare Other | Admitting: Surgery

## 2020-12-03 ENCOUNTER — Encounter: Payer: Self-pay | Admitting: Surgery

## 2020-12-03 ENCOUNTER — Ambulatory Visit: Payer: Self-pay

## 2020-12-03 VITALS — BP 124/71 | HR 66

## 2020-12-03 DIAGNOSIS — M7541 Impingement syndrome of right shoulder: Secondary | ICD-10-CM | POA: Diagnosis not present

## 2020-12-03 DIAGNOSIS — M79651 Pain in right thigh: Secondary | ICD-10-CM | POA: Diagnosis not present

## 2020-12-03 DIAGNOSIS — M25511 Pain in right shoulder: Secondary | ICD-10-CM

## 2020-12-03 DIAGNOSIS — M1611 Unilateral primary osteoarthritis, right hip: Secondary | ICD-10-CM | POA: Diagnosis not present

## 2020-12-03 DIAGNOSIS — G8929 Other chronic pain: Secondary | ICD-10-CM | POA: Diagnosis not present

## 2020-12-03 NOTE — Progress Notes (Signed)
Office Visit Note   Patient: Melissa Brennan           Date of Birth: [birth year]           MRN: [id number] Visit Date: 12/03/2020              Requested by: Charolette Forward, MD [phone] [address] [place] [address],  [place] [address] PCP: Charolette Forward, MD   Assessment & Plan: Visit Diagnoses:  1. Pain of right thigh   2. Chronic right shoulder pain   3. Arthritis of right hip   4. Impingement syndrome of right shoulder     Plan: Today I recommended conservative treatment of her right shoulder and right hip with injections.  I plan to do a right shoulder subacromial Marcaine/steroid injection today but patient states that she needs permission from her cardiologist since she is taking Eliquis.  I advised patient that I have performed this injection in the past on patient's were on blood thinners but again she stated that she needs his permission.  She will reach out to Dr. Terrence Dupont regarding this.  I was planning on asking Dr. Legrand Como hilts to perform ultrasound-guided diagnostic/therapeutic right hip intra-articular injection next week and since patient does not want injection in her shoulder to be performed today I will see if Dr. Junius Roads can do the injection at the same time as her hip.  I did advise patient that with the significant degenerative changes that she has of her right hip that ultimately it will come down her needing total hip replacement but at this time I do not think her pain is that bad and she also mention that she does not want a hip replacement.  If she does not get good relief with the subacromial right shoulder injection then she will be needing an MRI to better evaluate her rotator cuff.  All questions answered.  Follow-Up Instructions: Return in about 1 week (around 12/10/2020) for with dr hilts for right shoulder subacromial and US guided right hip intraarticular injection.   Orders:  Orders Placed This Encounter  Procedures  . XR HIP UNILAT W OR W/O PELVIS 2-3 VIEWS  RIGHT  . XR Shoulder Right   No orders of the defined types were placed in this encounter.     Procedures: No procedures performed   Clinical Data: No additional findings.   Subjective: Chief Complaint  Patient presents with  . Right Shoulder - Pain  . Right Leg - Pain    HPI 75 year old female comes in today with complaints of right shoulder pain and right thigh pain.  Right shoulder pain ongoing about 6 months.  No injury.  Pain when she is reaching overhead or internal rotation behind her back.  Pain also occasionally she lays on her right shoulder.  She denies cervical spinal radicular component.  Right thigh pain off and on for about 2 years.  Pain when she is up and ambulating, stands up from a chair.  She still tries to remain very active and recently just came back from vacation.  She feels like her right hip is getting stiff with flexion.  No complaints of groin pain but states that pain is in her thigh.  No complaints of low back pain or lumbar radicular symptoms.    Review of Systems No current pulmonary, GI issues.  Patient does have a history of atrial fibrillation and is on Eliquis.  Objective: Vital Signs: BP 124/71   Pulse 66   Physical Exam  HENT:     Head: Normocephalic.  Eyes:     Extraocular Movements: Extraocular movements intact.  Pulmonary:     Effort: No respiratory distress.  Musculoskeletal:     Comments: Left shoulder she has good range of motion but with some discomfort.  Negative drop arm test.  Moderate with positive impingement test.  Trace weakness with supraspinatus resistance.  Tender along the proximal biceps tendon.  No tendon defect.  Left hip she does have limitation with internal/external rotation.  These maneuvers reproduce some pain into her thigh.  Limited range of motion with hip flexion.  Negative straight leg raise.  Neurological:     General: No focal deficit present.     Mental Status: She is alert and oriented to person,  place, and time.     Ortho Exam  Specialty Comments:  No specialty comments available.  Imaging: No results found.   PMFS History: Patient Active Problem List   Diagnosis Date Noted  . Chronic GI bleeding 06/21/2019   Past Medical History:  Diagnosis Date  . A-fib ([place])   . Hypertension     History reviewed. No pertinent family history.  Past Surgical History:  Procedure Laterality Date  . ESOPHAGOGASTRODUODENOSCOPY (EGD) WITH PROPOFOL N/A 07/26/2019   Procedure: ESOPHAGOGASTRODUODENOSCOPY (EGD) WITH PROPOFOL;  Surgeon: Carol Ada, MD;  Location: [facility];  Service: Endoscopy;  Laterality: N/A;  . HOT HEMOSTASIS N/A 07/26/2019   Procedure: HOT HEMOSTASIS (ARGON PLASMA COAGULATION/BICAP);  Surgeon: Carol Ada, MD;  Location: [facility];  Service: Endoscopy;  Laterality: N/A;   Social History   Occupational History  . Not on file  Tobacco Use  . Smoking status: Never Smoker  . Smokeless tobacco: Never Used  Vaping Use  . Vaping Use: Never used  Substance and Sexual Activity  . Alcohol use: No  . Drug use: No  . Sexual activity: Not on file

## 2020-12-14 ENCOUNTER — Ambulatory Visit: Payer: Self-pay

## 2020-12-14 ENCOUNTER — Ambulatory Visit (INDEPENDENT_AMBULATORY_CARE_PROVIDER_SITE_OTHER): Payer: Medicare Other | Admitting: Family Medicine

## 2020-12-14 ENCOUNTER — Other Ambulatory Visit: Payer: Self-pay

## 2020-12-14 DIAGNOSIS — G8929 Other chronic pain: Secondary | ICD-10-CM

## 2020-12-14 DIAGNOSIS — M1611 Unilateral primary osteoarthritis, right hip: Secondary | ICD-10-CM | POA: Diagnosis not present

## 2020-12-14 DIAGNOSIS — M7541 Impingement syndrome of right shoulder: Secondary | ICD-10-CM

## 2020-12-14 DIAGNOSIS — M25511 Pain in right shoulder: Secondary | ICD-10-CM

## 2020-12-14 NOTE — Progress Notes (Signed)
Office Visit Note   Patient: Melissa Brennan           Date of Birth: [birth year]           MRN: [id number] Visit Date: 12/14/2020 Requested by: Charolette Forward, MD [phone] [address] [place],  [place] [address] PCP: Charolette Forward, MD  Subjective: Chief Complaint  Patient presents with  . Right Shoulder - Pain, Follow-up    Subacromial cortisone injection per Benjiman Core, PA & intra-articular right hip injection. Patient last took her eliquis on 12/11/20.   . Right Hip - Pain, Follow-up    HPI: 75yo F presenting to clinic for right shoulder subacromial injection and intraarticular hip injections. She takes eloquis, and stopped this medication two days ago in anticipation of needing these injections. She has no acute concerns today.   Objective: Vital Signs: There were no vitals taken for this visit.  Physical Exam:  General:  Alert and oriented, in no acute distress. Pulm:  Breathing unlabored. Psy:  Normal mood, congruent affect. Skin:  Right shoulder and right inguinal area overlying skin intact, with no bruising, rashes, or erythema.   Pain with overhead motion of right shoulder, and internal rotation of right hip  Imaging/Procedures: US Guided Needle Placement - No Linked Charges  Result Date: 12/14/2020 Ultrasound guided injection is preferred based studies that show increased duration, increased effect, greater accuracy, decreased procedural pain, increased response rate, and decreased cost with ultrasound guided versus blind injection.   Verbal informed consent obtained.  Time-out conducted.  Noted no overlying erythema, induration, or other signs of local infection. Ultrasound-guided right hip injection: After sterile prep with Betadine, injected 4 cc 0.25% bupivacaine without epinephrine and 6 mg betamethasone using a 22-gauge spinal needle, passing the needle through the iliofemoral ligament into the femoral head/neck junction.  Injectate seen filling joint capsule.   Good immediate relief.  Right Shoulder Subacromial Injection:  Right shoulder Subacromial Cortisone Injection:  Risks and benefits of procedure discussed, Patient opted to proceed. verbal Consent obtained.  Timeout performed.  Skin prepped in a sterile fashion with betadine before further cleansing with alcohol. Ethyl Chloride was used for topical analgesia.  Right subacromial area was injected with 4cc 0.25% bupivacaine without epinephrine via the posterior approach using a 25G, 1.5in needle. Syringe was removed from the needle, and 6mg  betamethasone was then injected into the area.   Patient tolerated the injection well with no immediate complications. Aftercare instructions were discussed, and patient was given strict return precautions.   Assessment & Plan: 75yo F presenting to clinic for right hip intraarticular injection under US guidance, and right subacromial injection. Procedures performed as described above, which patient tolerated very well. No immediate complications. Aftercare and return precautions discussed.     PMFS History: Patient Active Problem List   Diagnosis Date Noted  . Chronic GI bleeding 06/21/2019   Past Medical History:  Diagnosis Date  . A-fib ([place])   . Hypertension     No family history on file.  Past Surgical History:  Procedure Laterality Date  . ESOPHAGOGASTRODUODENOSCOPY (EGD) WITH PROPOFOL N/A 07/26/2019   Procedure: ESOPHAGOGASTRODUODENOSCOPY (EGD) WITH PROPOFOL;  Surgeon: Carol Ada, MD;  Location: [facility];  Service: Endoscopy;  Laterality: N/A;  . HOT HEMOSTASIS N/A 07/26/2019   Procedure: HOT HEMOSTASIS (ARGON PLASMA COAGULATION/BICAP);  Surgeon: Carol Ada, MD;  Location: [facility];  Service: Endoscopy;  Laterality: N/A;   Social History   Occupational History  . Not on file  Tobacco Use  .  Smoking status: Never Smoker  . Smokeless tobacco: Never Used  Vaping Use  . Vaping Use: Never used  Substance and Sexual Activity   . Alcohol use: No  . Drug use: No  . Sexual activity: Not on file

## 2020-12-14 NOTE — Progress Notes (Signed)
Subjective: Patient is here for ultrasound-guided intra-articular right hip injection.   Also for right shoulder subacromial injection.  Is off xarelto for these.  Objective:  Full ROM of shoulder.  Decreased IR of right hip.  Procedure: Ultrasound guided injection is preferred based studies that show increased duration, increased effect, greater accuracy, decreased procedural pain, increased response rate, and decreased cost with ultrasound guided versus blind injection.   Verbal informed consent obtained.  Time-out conducted.  Noted no overlying erythema, induration, or other signs of local infection. Ultrasound-guided right hip injection: After sterile prep with Betadine, injected 4 cc 0.25% bupivacaine without epinephrine and 6 mg betamethasone using a 22-gauge spinal needle, passing the needle through the iliofemoral ligament into the femoral head/neck junction.  Injectate seen filling joint capsule.  Good immediate relief.  Also injected right shoulder posterior subacromial space with 4 cc 0.25% bupivacaine and 6 mg betamethasone.

## 2020-12-30 ENCOUNTER — Ambulatory Visit (INDEPENDENT_AMBULATORY_CARE_PROVIDER_SITE_OTHER): Payer: Medicare Other | Admitting: Otolaryngology

## 2020-12-30 ENCOUNTER — Other Ambulatory Visit: Payer: Self-pay

## 2020-12-30 VITALS — Temp 96.6°F

## 2020-12-30 DIAGNOSIS — H6123 Impacted cerumen, bilateral: Secondary | ICD-10-CM | POA: Diagnosis not present

## 2020-12-30 NOTE — Progress Notes (Addendum)
HPI: Melissa Brennan is a 75 y.o. female who presents for evaluation of wax buildup in her ears as well as some recent bouts of dizziness.  She states that she occasionally gets a dizziness or sensation of spinning at night when she lies on her left side.  This comes and goes and has not been bad.  She has been previously treated with physical therapy.  She is not having any hearing problems.  And has not noted any significant change in her hearing. 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  She is not wearing hearing aids.  Past Medical History:  Diagnosis Date  . A-fib ([place])   . Hypertension    Past Surgical History:  Procedure Laterality Date  . ESOPHAGOGASTRODUODENOSCOPY (EGD) WITH PROPOFOL N/A 07/26/2019   Procedure: ESOPHAGOGASTRODUODENOSCOPY (EGD) WITH PROPOFOL;  Surgeon: Carol Ada, MD;  Location: [facility];  Service: Endoscopy;  Laterality: N/A;  . HOT HEMOSTASIS N/A 07/26/2019   Procedure: HOT HEMOSTASIS (ARGON PLASMA COAGULATION/BICAP);  Surgeon: Carol Ada, MD;  Location: [facility];  Service: Endoscopy;  Laterality: N/A;   Social History   Socioeconomic History  . Marital status: Married    Spouse name: Not on file  . Number of children: Not on file  . Years of education: Not on file  . Highest education level: Not on file  Occupational History  . Not on file  Tobacco Use  . Smoking status: Never Smoker  . Smokeless tobacco: Never Used  Vaping Use  . Vaping Use: Never used  Substance and Sexual Activity  . Alcohol use: No  . Drug use: No  . Sexual activity: Not on file  Other Topics Concern  . Not on file  Social History Narrative  . Not on file   Social Determinants of Health   Financial Resource Strain: Not on file  Food Insecurity: Not on file  Transportation Needs: Not on file  Physical Activity: Not on  file  Stress: Not on file  Social Connections: Not on file   No family history on file. No Known Allergies Prior to Admission medications   Medication Sig Start Date End Date Taking? Authorizing Provider  acetaminophen (TYLENOL) 325 MG tablet Take 650 mg by mouth every 6 (six) hours as needed for moderate pain or headache.    [provider]  amiodarone (PACERONE) 100 MG tablet Take 100 mg by mouth daily.    [provider]  apixaban (ELIQUIS) 5 MG TABS tablet Take 5 mg by mouth 2 (two) times daily.    [provider]  Ascorbic Acid (VITAMIN C PO) Take 1 tablet by mouth daily.    [provider]  Azilsartan-Chlorthalidone (EDARBYCLOR) 40-12.5 MG TABS Take 0.5 tablets by mouth daily.    [provider]  Cyanocobalamin (B-12 PO) Take 1 tablet by mouth daily.    [provider]  Ferrous Sulfate (IRON PO) Take 1 tablet by mouth daily.    [provider]  ibuprofen (ADVIL) 800 MG tablet Take 1 tablet (800 mg total) by mouth every 6 (six) hours as needed. 04/29/20   Felipa Furnace, DPM  levofloxacin (LEVAQUIN) 500 MG tablet Take 1 tablet (500 mg total) by mouth daily. 05/18/20   McDonald, Stephan Minister, DPM  olmesartan (BENICAR) 5 MG tablet Take by mouth daily.    [provider]  omeprazole (  PRILOSEC) 20 MG capsule Take 20 mg by mouth daily.    [provider]  rosuvastatin (CRESTOR) 10 MG tablet Take 10 mg by mouth every evening.    [provider]     Positive ROS: Otherwise negative  All other systems have been reviewed and were otherwise negative with the exception of those mentioned in the HPI and as above.  Physical Exam: Constitutional: Alert, well-appearing, no acute distress Ears: External ears without lesions or tenderness. Ear canals are small bilaterally with wax buildup on both sides but worse on the left side.  This was cleaned with curettes suction and forceps.  The TMs were clear bilaterally with  good mobility on pneumatic otoscopy.  On Dix-Hallpike testing she had no evidence of BPPV or nystagmus in the office today.. Nasal: External nose without lesions. Clear nasal passages Oral: Oropharynx clear. Neck: No palpable adenopathy or masses Respiratory: Breathing comfortably  Skin: No facial/neck lesions or rash noted.  Procedures  Assessment: Wax buildup left side worse than right Questionable BPPV although no evidence on exam in the office today.  Plan: Ear canals were cleaned. If she continues to have dizzy problems or they worsen consider further evaluation with physical therapy for vestibular rehab and treatment of BPPV.  Radene Journey, MD

## 2021-01-26 ENCOUNTER — Other Ambulatory Visit: Payer: Self-pay | Admitting: Gastroenterology

## 2021-02-09 ENCOUNTER — Other Ambulatory Visit (HOSPITAL_COMMUNITY)
Admission: RE | Admit: 2021-02-09 | Discharge: 2021-02-09 | Disposition: A | Discharge status: Home / Self Care | Payer: Medicare Other | Source: Ambulatory Visit | Attending: Gastroenterology | Admitting: Gastroenterology

## 2021-02-09 DIAGNOSIS — Z20822 Contact with and (suspected) exposure to covid-19: Secondary | ICD-10-CM | POA: Insufficient documentation

## 2021-02-09 DIAGNOSIS — Z01812 Encounter for preprocedural laboratory examination: Secondary | ICD-10-CM | POA: Diagnosis present

## 2021-02-09 NOTE — Progress Notes (Signed)
Attempted to obtain medical history via telephone, unable to reach at this time. I left a voicemail to return pre surgical testing department's phone call.  

## 2021-02-10 LAB — SARS CORONAVIRUS 2 (TAT 6-24 HRS): SARS Coronavirus 2: NEGATIVE

## 2021-02-11 NOTE — Anesthesia Preprocedure Evaluation (Addendum)
Anesthesia Evaluation  Patient identified by MRN, date of birth, ID band Patient awake    Reviewed: Allergy & Precautions, NPO status , Patient's Chart, lab work & pertinent test results  Airway Mallampati: II  TM Distance: >3 FB Neck ROM: Full    Dental no notable dental hx. (+) Teeth Intact, Dental Advisory Given   Pulmonary    Pulmonary exam normal breath sounds clear to auscultation       Cardiovascular hypertension, Pt. on medications Normal cardiovascular exam Rhythm:Regular Rate:Normal     Neuro/Psych    GI/Hepatic Neg liver ROS, GERD  ,  Endo/Other  negative endocrine ROS  Renal/GU negative Renal ROS     Musculoskeletal   Abdominal   Peds  Hematology   Anesthesia Other Findings   Reproductive/Obstetrics                            Anesthesia Physical Anesthesia Plan  ASA: III  Anesthesia Plan: MAC   Post-op Pain Management:    Induction:   PONV Risk Score and Plan: Treatment may vary due to age or medical condition  Airway Management Planned: Natural Airway  Additional Equipment: None  Intra-op Plan:   Post-operative Plan:   Informed Consent: I have reviewed the patients History and Physical, chart, labs and discussed the procedure including the risks, benefits and alternatives for the proposed anesthesia with the patient or authorized representative who has indicated his/her understanding and acceptance.     Dental advisory given  Plan Discussed with: CRNA  Anesthesia Plan Comments: (enteroscopy for heme Pos Stools)       Anesthesia Quick Evaluation

## 2021-02-12 ENCOUNTER — Encounter (HOSPITAL_COMMUNITY)
Admission: RE | Disposition: A | Discharge status: Home / Self Care | Payer: Self-pay | Source: Home / Self Care | Attending: Gastroenterology

## 2021-02-12 ENCOUNTER — Ambulatory Visit (HOSPITAL_COMMUNITY)
Admission: RE | Admit: 2021-02-12 | Discharge: 2021-02-12 | Disposition: A | Discharge status: Home / Self Care | Payer: Medicare Other | Attending: Gastroenterology | Admitting: Gastroenterology

## 2021-02-12 ENCOUNTER — Ambulatory Visit (HOSPITAL_COMMUNITY): Payer: Medicare Other | Admitting: Anesthesiology

## 2021-02-12 ENCOUNTER — Encounter (HOSPITAL_COMMUNITY): Payer: Self-pay | Admitting: Gastroenterology

## 2021-02-12 ENCOUNTER — Other Ambulatory Visit: Payer: Self-pay

## 2021-02-12 DIAGNOSIS — Z8719 Personal history of other diseases of the digestive system: Secondary | ICD-10-CM | POA: Diagnosis not present

## 2021-02-12 DIAGNOSIS — K921 Melena: Secondary | ICD-10-CM | POA: Insufficient documentation

## 2021-02-12 DIAGNOSIS — Z79899 Other long term (current) drug therapy: Secondary | ICD-10-CM | POA: Diagnosis not present

## 2021-02-12 HISTORY — PX: ENTEROSCOPY: SHX5533

## 2021-02-12 SURGERY — ENTEROSCOPY
Anesthesia: Monitor Anesthesia Care

## 2021-02-12 MED ORDER — PROPOFOL 500 MG/50ML IV EMUL
INTRAVENOUS | Status: AC
  Filled 2021-02-12: qty 50

## 2021-02-12 MED ORDER — PROPOFOL 10 MG/ML IV BOLUS
INTRAVENOUS | Status: DC | PRN
  Administered 2021-02-12: 40 mg via INTRAVENOUS
  Administered 2021-02-12: 10 mg via INTRAVENOUS

## 2021-02-12 MED ORDER — SODIUM CHLORIDE 0.9 % IV SOLN: INTRAVENOUS | Status: DC

## 2021-02-12 MED ORDER — LACTATED RINGERS IV SOLN
INTRAVENOUS | Status: AC | PRN
  Administered 2021-02-12: 10 mL/h via INTRAVENOUS

## 2021-02-12 MED ORDER — PROPOFOL 500 MG/50ML IV EMUL
INTRAVENOUS | Status: DC | PRN
  Administered 2021-02-12: 135 ug/kg/min via INTRAVENOUS

## 2021-02-12 MED ORDER — LIDOCAINE HCL 1 % IJ SOLN
INTRAMUSCULAR | Status: DC | PRN
  Administered 2021-02-12: 60 mg via INTRADERMAL

## 2021-02-12 NOTE — Discharge Instructions (Signed)

## 2021-02-12 NOTE — Anesthesia Postprocedure Evaluation (Signed)
Anesthesia Post Note  Patient: Melissa Brennan  Procedure(s) Performed: ENTEROSCOPY (N/A )     Patient location during evaluation: PACU Anesthesia Type: MAC Level of consciousness: awake and alert Pain management: pain level controlled Vital Signs Assessment: post-procedure vital signs reviewed and stable Respiratory status: spontaneous breathing, nonlabored ventilation, respiratory function stable and patient connected to nasal cannula oxygen Cardiovascular status: stable and blood pressure returned to baseline Postop Assessment: no apparent nausea or vomiting Anesthetic complications: no   No complications documented.  Last Vitals:  Vitals:   02/12/21 0930 02/12/21 0940  BP: 127/63 136/68  Pulse: 71 68  Resp: 15 12  Temp:    SpO2: 96% 95%    Last Pain:  Vitals:   02/12/21 0940  TempSrc:   PainSc: 0-No pain                 Barnet Glasgow

## 2021-02-12 NOTE — Transfer of Care (Signed)
Immediate Anesthesia Transfer of Care Note  Patient: Melissa Brennan  Procedure(s) Performed: ENTEROSCOPY (N/A )  Patient Location: PACU and Endoscopy Unit  Anesthesia Type:MAC  Level of Consciousness: oriented, drowsy and patient cooperative  Airway & Oxygen Therapy: Patient Spontanous Breathing and Patient connected to face mask oxygen  Post-op Assessment: Report given to RN and Post -op Vital signs reviewed and stable  Post vital signs: Reviewed and stable  Last Vitals:  Vitals Value Taken Time  BP 116/56 02/12/21 0917  Temp    Pulse 64 02/12/21 0918  Resp 17 02/12/21 0918  SpO2 100 % 02/12/21 0918  Vitals shown include unvalidated device data.  Last Pain:  Vitals:   02/12/21 0801  TempSrc: Oral  PainSc: 0-No pain         Complications: No complications documented.

## 2021-02-12 NOTE — Op Note (Signed)
[facility] Patient Name: Melissa Brennan Procedure Date: 02/12/2021 MRN: [id number] Attending MD: Carol Ada , MD Date of Birth: [birth year] CSN: [id number] Age: 75 Admit Type: Outpatient Procedure:                Small bowel enteroscopy Indications:              Melena, Occult blood in stool Providers:                Carol Ada, MD, Baird Cancer, RN, Laverda Sorenson,                            Technician, Arnoldo Hooker, CRNA Referring MD:              Medicines:                Propofol per Anesthesia Complications:            No immediate complications. Estimated Blood Loss:     Estimated blood loss: none. Procedure:                Pre-Anesthesia Assessment:                           - Prior to the procedure, a History and Physical                            was performed, and patient medications and                            allergies were reviewed. The patient's tolerance of                            previous anesthesia was also reviewed. The risks                            and benefits of the procedure and the sedation                            options and risks were discussed with the patient.                            All questions were answered, and informed consent                            was obtained. Prior Anticoagulants: The patient has                            taken no previous anticoagulant or antiplatelet                            agents. ASA Grade Assessment: II - A patient with                            mild systemic disease. After reviewing the risks  and benefits, the patient was deemed in                            satisfactory condition to undergo the procedure.                           - Sedation was administered by an anesthesia                            professional. Deep sedation was attained.                           After obtaining informed consent, the endoscope was                            passed under  direct vision. Throughout the                            procedure, the patient's blood pressure, pulse, and                            oxygen saturations were monitored continuously. The                            [id number]-[id number] ([id number] Olympus pediatric colonscope                            was introduced through the mouth and advanced to                            the small bowel distal to the Ligament of Treitz.                            The small bowel enteroscopy was accomplished                            without difficulty. The patient tolerated the                            procedure well. Scope In: Scope Out: Findings:      The esophagus was normal.      The stomach was normal.      The examined duodenum was normal.      There was no evidence of significant pathology in the entire examined       portion of jejunum.      Two deep passes into the small bowel were performed and there was no       evidence of inflammation, ulcerations, erosions, or vascular       abnormalities. Impression:               - Normal esophagus.                           - Normal stomach.                           -  Normal examined duodenum.                           - The examined portion of the jejunum was normal.                           - No specimens collected. Recommendation:           - Patient has a contact number available for                            emergencies. The signs and symptoms of potential                            delayed complications were discussed with the                            patient. Return to normal activities tomorrow.                            Written discharge instructions were provided to the                            patient.                           - Resume regular diet.                           - CBC in one month. Procedure Code(s):        --- Professional ---                           [phone], Small intestinal endoscopy, enteroscopy                             beyond second portion of duodenum, not including                            ileum; diagnostic, including collection of                            specimen(s) by brushing or washing, when performed                            (separate procedure) Diagnosis Code(s):        --- Professional ---                           K92.1, Melena (includes Hematochezia)                           R19.5, Other fecal abnormalities CPT copyright 2019 American Medical Association. All rights reserved. The codes documented in this report are preliminary and upon coder review may  be revised to meet current compliance requirements. Carol Ada, MD Carol Ada, MD 02/12/2021 9:22:25 AM This report has been signed electronically.  Number of Addenda: 0 

## 2021-02-12 NOTE — Consult Note (Signed)
  Melissa Brennan   HPI: Over the past two days she noticed two black stools, but the amount was small. She denies using Peptobismol. 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  The patient was noted to be heme positive.   Past Medical History:  Diagnosis Date  . A-fib ([place])   . Hypertension     Past Surgical History:  Procedure Laterality Date  . ESOPHAGOGASTRODUODENOSCOPY (EGD) WITH PROPOFOL N/A 07/26/2019   Procedure: ESOPHAGOGASTRODUODENOSCOPY (EGD) WITH PROPOFOL;  Surgeon: Carol Ada, MD;  Location: [facility];  Service: Endoscopy;  Laterality: N/A;  . HOT HEMOSTASIS N/A 07/26/2019   Procedure: HOT HEMOSTASIS (ARGON PLASMA COAGULATION/BICAP);  Surgeon: Carol Ada, MD;  Location: [facility];  Service: Endoscopy;  Laterality: N/A;    History reviewed. No pertinent family history.  Social History:  reports that she has never smoked. She has never used smokeless tobacco. She reports that she does not drink alcohol and does not use drugs.  Allergies: No Known Allergies  Medications:  Scheduled:  Continuous: . sodium chloride    . lactated ringers 10 mL/hr (02/12/21 0804)    No results found for this or any previous visit (from the past 24 hour(s)).   No results found.  ROS:  As stated above in the HPI otherwise negative.  Blood pressure 121/81, pulse 73, temperature 98.5 F (36.9 C), temperature source Oral, resp. rate 15, height 5\' 5"  (1.651 m), weight 52.6 kg, SpO2 100 %.    PE: Gen: NAD, Alert and Oriented HEENT:  [place]/AT, EOMI Neck: Supple, no LAD Lungs: CTA Bilaterally CV: RRR without M/G/R ABD: Soft, NTND, +BS Ext: No C/C/E  Assessment/Plan: 1) AVMs. 2) Heme positive stool.  Plan: 1) Enteroscopy with APC.  Melissa Brennan D 02/12/2021, 8:33 AM

## 2021-02-15 ENCOUNTER — Encounter (HOSPITAL_COMMUNITY): Payer: Self-pay | Admitting: Gastroenterology

## 2021-03-18 ENCOUNTER — Encounter: Payer: Self-pay | Admitting: Podiatry

## 2021-03-18 ENCOUNTER — Ambulatory Visit (INDEPENDENT_AMBULATORY_CARE_PROVIDER_SITE_OTHER): Payer: Medicare Other | Admitting: Podiatry

## 2021-03-18 ENCOUNTER — Other Ambulatory Visit: Payer: Self-pay

## 2021-03-18 ENCOUNTER — Ambulatory Visit (INDEPENDENT_AMBULATORY_CARE_PROVIDER_SITE_OTHER): Payer: Medicare Other

## 2021-03-18 DIAGNOSIS — M19072 Primary osteoarthritis, left ankle and foot: Secondary | ICD-10-CM

## 2021-03-18 DIAGNOSIS — M2580 Other specified joint disorders, unspecified joint: Secondary | ICD-10-CM | POA: Diagnosis not present

## 2021-03-18 DIAGNOSIS — M2012 Hallux valgus (acquired), left foot: Secondary | ICD-10-CM

## 2021-03-18 NOTE — Progress Notes (Signed)
She presents today for follow-up of her first metatarsal phalangeal joint left foot.  States that is still painful.  States that it is painful now as it was prior to surgery.  She states that is right here she points to the fibular sesamoid beneath the first metatarsal phalangeal joint.  She states that range of motion of the toe does not hurt but every time she steps on the foot it is painful and she is having to walk to the outside of her foot now.  Objective: Vital signs are stable she is alert and oriented x3.  Pulses are palpable.  She has acceptable range of motion of the first metatarsophalangeal joint with no pain though she does have pain on direct palpation of the fibular sesamoid.  There is no erythema cellulitis drainage or odor associated with it.  Radiographs taken today demonstrate what appears to be osteoarthritic changes of the fibular sesamoids with a cyst as well.  Also internal fixation of the proximal phalanx as a stable and Herbert screws for the first metatarsal osteotomy that has collapsed medially providing her with some more mild bump deformity.  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  Plan: Requesting a CT scan for evaluation of the sesamoidal apparatus specifically.

## 2021-04-02 ENCOUNTER — Ambulatory Visit (HOSPITAL_COMMUNITY)
Admission: EM | Admit: 2021-04-02 | Discharge: 2021-04-02 | Disposition: A | Discharge status: Home / Self Care | Payer: Medicare Other | Attending: Emergency Medicine | Admitting: Emergency Medicine

## 2021-04-02 ENCOUNTER — Other Ambulatory Visit: Payer: Self-pay

## 2021-04-02 ENCOUNTER — Encounter (HOSPITAL_COMMUNITY): Payer: Self-pay

## 2021-04-02 DIAGNOSIS — R519 Headache, unspecified: Secondary | ICD-10-CM | POA: Insufficient documentation

## 2021-04-02 DIAGNOSIS — R509 Fever, unspecified: Secondary | ICD-10-CM | POA: Diagnosis present

## 2021-04-02 DIAGNOSIS — U071 COVID-19: Secondary | ICD-10-CM | POA: Diagnosis not present

## 2021-04-02 LAB — POC INFLUENZA A AND B ANTIGEN (URGENT CARE ONLY)
INFLUENZA A ANTIGEN, POC: NEGATIVE
INFLUENZA B ANTIGEN, POC: NEGATIVE

## 2021-04-02 NOTE — Discharge Instructions (Addendum)
Today her vital signs are stable and your exam did not show any signs of infection or concern  Your fever is most likely being caused by a virus which will work its way out of your system on its own, you can continue to take Tylenol as needed  The COVID test is pending for 24 hours, you will be called if positive  Flu test is pending, you will be called if positive

## 2021-04-02 NOTE — ED Triage Notes (Signed)
Pt reports fever 100.8 F and fatigue since this morning. Pt reports she was told by her PCP she needs x rays, lab test and COVID test.

## 2021-04-02 NOTE — ED Provider Notes (Signed)
[place]    CSN: [id number] Arrival date & time: 04/02/21  0954      History   Chief Complaint Chief Complaint  Patient presents with   Fever   Headache    HPI Melissa Brennan is a 75 y.o. female.   Patient presents today with fever of  "111.0" beginning this morning.  Use with use of 650 mg of Tylenol.  Denies chills, body aches, ear pain or fullness, headaches, sore throat, cough, shortness of breath, chest pain or tightness, abdominal pain, nausea, vomiting, diarrhea.  No known sick contacts.  Vaccinated.  Past Medical History:  Diagnosis Date   A-fib [facility])    Hypertension     Patient Active Problem List   Diagnosis Date Noted   Chronic GI bleeding 06/21/2019    Past Surgical History:  Procedure Laterality Date   ENTEROSCOPY N/A 02/12/2021   Procedure: ENTEROSCOPY;  Surgeon: Carol Ada, MD;  Location: [facility];  Service: Endoscopy;  Laterality: N/A;   ESOPHAGOGASTRODUODENOSCOPY (EGD) WITH PROPOFOL N/A 07/26/2019   Procedure: ESOPHAGOGASTRODUODENOSCOPY (EGD) WITH PROPOFOL;  Surgeon: Carol Ada, MD;  Location: [facility];  Service: Endoscopy;  Laterality: N/A;   HOT HEMOSTASIS N/A 07/26/2019   Procedure: HOT HEMOSTASIS (ARGON PLASMA COAGULATION/BICAP);  Surgeon: Carol Ada, MD;  Location: [facility];  Service: Endoscopy;  Laterality: N/A;    OB History   No obstetric history on file.      Home Medications    Prior to Admission medications   Medication Sig Start Date End Date Taking? Authorizing Provider  acetaminophen (TYLENOL) 325 MG tablet Take 650 mg by mouth every 6 (six) hours as needed for moderate pain or headache.    [provider]  amiodarone (PACERONE) 100 MG tablet Take 100 mg by mouth daily. 09/11/20   [provider]  apixaban (ELIQUIS) 5 MG TABS tablet Take 5 mg by mouth 2 (two) times daily.    [provider]  Azilsartan-Chlorthalidone (EDARBYCLOR) 40-12.5 MG TABS Take 0.5 tablets by mouth  daily.    [provider]  Cholecalciferol (VITAMIN D-3) 125 MCG (5000 UT) TABS Take 5,000 mg by mouth daily.    [provider]  omeprazole (PRILOSEC) 20 MG capsule Take 20 mg by mouth daily.    [provider]  rosuvastatin (CRESTOR) 20 MG tablet Take 20 mg by mouth daily. 12/19/20   [provider]    Family History History reviewed. No pertinent family history.  Social History Social History   Tobacco Use   Smoking status: Never   Smokeless tobacco: Never  Vaping Use   Vaping Use: Never used  Substance Use Topics   Alcohol use: No   Drug use: No     Allergies   Patient has no known allergies.   Review of Systems Review of Systems  Constitutional:  Positive for fever. Negative for activity change, appetite change, chills, diaphoresis, fatigue and unexpected weight change.  HENT: Negative.    Respiratory: Negative.    Cardiovascular: Negative.   Gastrointestinal: Negative.   Genitourinary: Negative.   Skin: Negative.   Neurological: Negative.     Physical Exam Triage Vital Signs ED Triage Vitals  Enc Vitals Group     BP 04/02/21 1117 106/62     Pulse Rate 04/02/21 1117 82     Resp 04/02/21 1117 18     Temp 04/02/21 1117 99.3 F (37.4 C)     Temp Source 04/02/21 1117 Oral     SpO2 04/02/21 1117 100 %  Weight --      Height --      Head Circumference --      Peak Flow --      Pain Score 04/02/21 1115 0     Pain Loc --      Pain Edu? --      Excl. in [place]? --    No data found.  Updated Vital Signs BP 106/62 (BP Location: Right Arm)   Pulse 82   Temp 99.3 F (37.4 C) (Oral)   Resp 18   SpO2 100%   Visual Acuity Right Eye Distance:   Left Eye Distance:   Bilateral Distance:    Right Eye Near:   Left Eye Near:    Bilateral Near:     Physical Exam Constitutional:      Appearance: Normal appearance. She is normal weight.  HENT:     Head: Normocephalic.     Right Ear: Tympanic membrane, ear canal and  external ear normal.     Left Ear: Tympanic membrane, ear canal and external ear normal.     Nose: Nose normal.     Mouth/Throat:     Mouth: Mucous membranes are moist.     Pharynx: Oropharynx is clear.  Eyes:     Extraocular Movements: Extraocular movements intact.     Conjunctiva/sclera: Conjunctivae normal.     Pupils: Pupils are equal, round, and reactive to light.  Cardiovascular:     Rate and Rhythm: Normal rate and regular rhythm.     Pulses: Normal pulses.     Heart sounds: Normal heart sounds.  Pulmonary:     Effort: Pulmonary effort is normal.     Breath sounds: Normal breath sounds.  Abdominal:     General: Abdomen is flat. Bowel sounds are normal.     Palpations: Abdomen is soft.  Musculoskeletal:        General: Normal range of motion.     Cervical back: Normal range of motion and neck supple.  Skin:    General: Skin is warm and dry.  Neurological:     Mental Status: She is alert and oriented to person, place, and time. Mental status is at baseline.  Psychiatric:        Mood and Affect: Mood normal.        Behavior: Behavior normal.     UC Treatments / Results  Labs (all labs ordered are listed, but only abnormal results are displayed) Labs Reviewed  SARS CORONAVIRUS 2 (TAT 6-24 HRS)  POC INFLUENZA A AND B ANTIGEN (URGENT CARE ONLY)    EKG   Radiology No results found.  Procedures Procedures (including critical care time)  Medications Ordered in UC Medications - No data to display  Initial Impression / Assessment and Plan / UC Course  I have reviewed the triage vital signs and the nursing notes.  Pertinent labs & imaging results that were available during my care of the patient were reviewed by me and considered in my medical decision making (see chart for details).  Fever  Exam unremarkable, low-grade fever present but otherwise vital signs stable, patient in no signs of distress or ill-appearing, discussed that we will move forward  conservatively and we will not do chest x-ray or lab work at this time, patient in agreement with plan  1.  COVID test pending 2.  Flu test pending 3.  Can continue use of Tylenol for fever management Final Clinical Impressions(s) / UC Diagnoses   Final diagnoses:  Fever,  unspecified     Discharge Instructions      Today her vital signs are stable and your exam did not show any signs of infection or concern  Your fever is most likely being caused by a virus which will work its way out of your system on its own, you can continue to take Tylenol as needed  The COVID test is pending for 24 hours, you will be called if positive  Flu test is pending, you will be called if positive   ED Prescriptions   None    PDMP not reviewed this encounter.   Hans Eden, NP 04/02/21 1148

## 2021-04-03 LAB — SARS CORONAVIRUS 2 (TAT 6-24 HRS): SARS Coronavirus 2: POSITIVE — AB

## 2021-04-07 ENCOUNTER — Ambulatory Visit (INDEPENDENT_AMBULATORY_CARE_PROVIDER_SITE_OTHER): Payer: Medicare Other

## 2021-04-07 ENCOUNTER — Other Ambulatory Visit: Payer: Self-pay

## 2021-04-07 ENCOUNTER — Encounter (HOSPITAL_COMMUNITY): Payer: Self-pay

## 2021-04-07 ENCOUNTER — Ambulatory Visit (HOSPITAL_COMMUNITY)
Admission: EM | Admit: 2021-04-07 | Discharge: 2021-04-07 | Disposition: A | Discharge status: Home / Self Care | Payer: Medicare Other | Attending: Medical Oncology | Admitting: Medical Oncology

## 2021-04-07 DIAGNOSIS — R059 Cough, unspecified: Secondary | ICD-10-CM | POA: Diagnosis not present

## 2021-04-07 DIAGNOSIS — R079 Chest pain, unspecified: Secondary | ICD-10-CM

## 2021-04-07 DIAGNOSIS — M549 Dorsalgia, unspecified: Secondary | ICD-10-CM

## 2021-04-07 DIAGNOSIS — U071 COVID-19: Secondary | ICD-10-CM | POA: Diagnosis not present

## 2021-04-07 MED ORDER — ALBUTEROL SULFATE HFA 108 (90 BASE) MCG/ACT IN AERS: 1.0000 | INHALATION_SPRAY | Freq: Four times a day (QID) | RESPIRATORY_TRACT | 0 refills | Status: DC | PRN

## 2021-04-07 NOTE — ED Provider Notes (Addendum)
[place]    CSN: [id number] Arrival date & time: 04/07/21  1230      History   Chief Complaint Chief Complaint  Patient presents with   Cough   Back Pain    HPI Chairty Brennan is a 75 y.o. female.   HPI  Cough: Patient reports that she has had a dry cough and back pain for the past 3. She reports that her back pain occurs mostly when she coughs. Per patient her PCP would like for her to have a chest x ray given her symptoms to assess for any potential pneumonia.   Past Medical History:  Diagnosis Date   A-fib [facility])    Hypertension     Patient Active Problem List   Diagnosis Date Noted   Chronic GI bleeding 06/21/2019    Past Surgical History:  Procedure Laterality Date   ENTEROSCOPY N/A 02/12/2021   Procedure: ENTEROSCOPY;  Surgeon: Carol Ada, MD;  Location: [facility];  Service: Endoscopy;  Laterality: N/A;   ESOPHAGOGASTRODUODENOSCOPY (EGD) WITH PROPOFOL N/A 07/26/2019   Procedure: ESOPHAGOGASTRODUODENOSCOPY (EGD) WITH PROPOFOL;  Surgeon: Carol Ada, MD;  Location: [facility];  Service: Endoscopy;  Laterality: N/A;   HOT HEMOSTASIS N/A 07/26/2019   Procedure: HOT HEMOSTASIS (ARGON PLASMA COAGULATION/BICAP);  Surgeon: Carol Ada, MD;  Location: [facility];  Service: Endoscopy;  Laterality: N/A;    OB History   No obstetric history on file.      Home Medications    Prior to Admission medications   Medication Sig Start Date End Date Taking? Authorizing Provider  acetaminophen (TYLENOL) 325 MG tablet Take 650 mg by mouth every 6 (six) hours as needed for moderate pain or headache.    [provider]  amiodarone (PACERONE) 100 MG tablet Take 100 mg by mouth daily. 09/11/20   [provider]  apixaban (ELIQUIS) 5 MG TABS tablet Take 5 mg by mouth 2 (two) times daily.    [provider]  Azilsartan-Chlorthalidone (EDARBYCLOR) 40-12.5 MG TABS Take 0.5 tablets by mouth daily.    [provider]   Cholecalciferol (VITAMIN D-3) 125 MCG (5000 UT) TABS Take 5,000 mg by mouth daily.    [provider]  omeprazole (PRILOSEC) 20 MG capsule Take 20 mg by mouth daily.    [provider]  rosuvastatin (CRESTOR) 20 MG tablet Take 20 mg by mouth daily. 12/19/20   [provider]    Family History History reviewed. No pertinent family history.  Social History Social History   Tobacco Use   Smoking status: Never   Smokeless tobacco: Never  Vaping Use   Vaping Use: Never used  Substance Use Topics   Alcohol use: No   Drug use: No     Allergies   Patient has no known allergies.   Review of Systems Review of Systems  As stated above in HPI Physical Exam Triage Vital Signs ED Triage Vitals  Enc Vitals Group     BP 04/07/21 1417 124/76     Pulse Rate 04/07/21 1417 70     Resp 04/07/21 1417 16     Temp 04/07/21 1417 98.5 F (36.9 C)     Temp Source 04/07/21 1417 Oral     SpO2 04/07/21 1417 98 %     Weight --      Height --      Head Circumference --      Peak Flow --      Pain Score 04/07/21 1416 7  Pain Loc --      Pain Edu? --      Excl. in [place]? --    No data found.  Updated Vital Signs BP 124/76 (BP Location: Right Arm)   Pulse 70   Temp 98.5 F (36.9 C) (Oral)   Resp 16   SpO2 98%   Physical Exam Vitals and nursing note reviewed.  Constitutional:      General: She is not in acute distress.    Appearance: Normal appearance. She is not ill-appearing, toxic-appearing or diaphoretic.  HENT:     Head: Normocephalic and atraumatic.     Right Ear: Tympanic membrane normal.     Left Ear: Tympanic membrane normal.     Nose: Congestion present.     Mouth/Throat:     Mouth: Mucous membranes are moist.     Pharynx: No oropharyngeal exudate or posterior oropharyngeal erythema.  Eyes:     Extraocular Movements: Extraocular movements intact.     Pupils: Pupils are equal, round, and reactive to light.  Cardiovascular:     Rate and  Rhythm: Normal rate and regular rhythm.     Heart sounds: Normal heart sounds.  Pulmonary:     Effort: Pulmonary effort is normal.     Breath sounds: Normal breath sounds.  Musculoskeletal:     Cervical back: Normal range of motion and neck supple.  Lymphadenopathy:     Cervical: No cervical adenopathy.  Skin:    General: Skin is warm.  Neurological:     Mental Status: She is alert and oriented to person, place, and time.     UC Treatments / Results  Labs (all labs ordered are listed, but only abnormal results are displayed) Labs Reviewed - No data to display  EKG   Radiology No results found.  Procedures Procedures (including critical care time)  Medications Ordered in UC Medications - No data to display  Initial Impression / Assessment and Plan / UC Course  I have reviewed the triage vital signs and the nursing notes.  Pertinent labs & imaging results that were available during my care of the patient were reviewed by me and considered in my medical decision making (see chart for details).     New. X ray does not show evidence for acute concern- does suggest COPD. For this reason acute bronchitis secondary to COVID-19 likely. Sending in Albuterol for her to use PRN. Discussed how to use along with common potential side effects and precautions. Follow up PRN.  Final Clinical Impressions(s) / UC Diagnoses   Final diagnoses:  None   Discharge Instructions   None    ED Prescriptions   None    PDMP not reviewed this encounter.   Hughie Closs, PA-C 04/07/21 1626    [address], PA-C 04/07/21 1629

## 2021-04-07 NOTE — ED Triage Notes (Signed)
Pt in with c/o cough and back pain x Monday  States when she coughs is when she mostly feels the pain  States her pcp says she needs x ray of her lungs

## 2021-04-08 ENCOUNTER — Other Ambulatory Visit: Payer: Self-pay | Admitting: Podiatry

## 2021-04-08 ENCOUNTER — Ambulatory Visit
Admission: RE | Admit: 2021-04-08 | Discharge: 2021-04-08 | Disposition: A | Discharge status: Home / Self Care | Payer: Medicare Other | Source: Ambulatory Visit | Attending: Podiatry | Admitting: Podiatry

## 2021-04-08 DIAGNOSIS — M2580 Other specified joint disorders, unspecified joint: Secondary | ICD-10-CM

## 2021-04-08 DIAGNOSIS — M19072 Primary osteoarthritis, left ankle and foot: Secondary | ICD-10-CM

## 2021-04-20 ENCOUNTER — Ambulatory Visit
Admission: RE | Admit: 2021-04-20 | Discharge: 2021-04-20 | Disposition: A | Discharge status: Home / Self Care | Payer: Medicare Other | Source: Ambulatory Visit | Attending: Podiatry | Admitting: Podiatry

## 2021-04-20 DIAGNOSIS — M19072 Primary osteoarthritis, left ankle and foot: Secondary | ICD-10-CM

## 2021-04-20 DIAGNOSIS — M2580 Other specified joint disorders, unspecified joint: Secondary | ICD-10-CM

## 2021-04-26 ENCOUNTER — Telehealth: Payer: Self-pay | Admitting: *Deleted

## 2021-04-26 NOTE — Telephone Encounter (Signed)
"  I have a screw that's coming out of my toe.  It was shown on my CT scan.  I am having excruciating pain from it.  It has been waking me out of my sleep.  I called the [facility] office and spoke to someone.  They told me that Dr. Milinda Pointer was in the [facility] office today."  Dr. Milinda Pointer is out of the office today.  "When I see him for my appointment, will he take my screw out in the office?"  He will probably do a consult with you and then get you scheduled for a surgical procedure.  It will not be done in the office.  "What am I supposed to do for pain in the mean time?  I can only take Tylenol due to the other medications that I take."  I will send a message to Dr. Milinda Pointer and his assistant to see what he suggests for you.  You may not receive a response today due to him being out of the office but hopefully you will receive one tomorrow.  "Okay, thank you."

## 2021-04-27 NOTE — Telephone Encounter (Signed)
I called and informed Melissa Brennan of Dr. Stephenie Acres response.  Let her know the only thing she can do until I can see her is to put her boot or surgical shoe on and continue tylenol.  She also needs to decrease her ambulation until I can see her.

## 2021-04-29 ENCOUNTER — Other Ambulatory Visit: Payer: Self-pay

## 2021-04-29 ENCOUNTER — Telehealth: Payer: Self-pay | Admitting: Podiatry

## 2021-04-29 ENCOUNTER — Encounter: Payer: Self-pay | Admitting: Podiatry

## 2021-04-29 ENCOUNTER — Ambulatory Visit (INDEPENDENT_AMBULATORY_CARE_PROVIDER_SITE_OTHER): Payer: Medicare Other | Admitting: Podiatry

## 2021-04-29 DIAGNOSIS — G5762 Lesion of plantar nerve, left lower limb: Secondary | ICD-10-CM

## 2021-04-29 DIAGNOSIS — T847XXD Infection and inflammatory reaction due to other internal orthopedic prosthetic devices, implants and grafts, subsequent encounter: Secondary | ICD-10-CM

## 2021-04-29 DIAGNOSIS — M2012 Hallux valgus (acquired), left foot: Secondary | ICD-10-CM

## 2021-04-29 NOTE — Telephone Encounter (Signed)
Pt returned call stating someone just called her from our number. I talked with Caryl Pina and [place] and they did not so I told pt I was not sure who called but they should call back if they need to talk with her.

## 2021-05-02 NOTE — Progress Notes (Signed)
She presents today for follow-up of her CT scan.  She states that her foot is really starting to feel worse.  She states that she feels that the pain is right under here as she points to the base of the proximal phalanx.  States that this has been going on for 25 to 30 years and was not addressed with Dr. Serita Grit bunion repair.  She also has some tenderness and here as she points to the tibial sesamoid area.  In question is an area to the dorsal aspect of the first metatarsal.  Objective: Vital signs are stable alert and oriented x3.  There is no erythema edema cellulitis drainage or odor though she does have a small palpable mass just at the base of the proximal phalanx exquisitely tender on palpation.  This appears to be some type of neuroma or cutaneous nerve fiber.  It does not appear to be a ganglion or anything like that at all.  And it is too distal to be sesamoid related.  Though she does have tenderness on palpation of the sesamoids nothing compared to this area.  She also has tenderness on palpation dorsal aspect of the first metatarsophalangeal joint but has a good range of motion.  CT does demonstrate some osteoarthritic changes of the head of the first metatarsal as well as the sesamoidal apparatus.  Does not state any thing about a soft tissue mass.  Assessment: Painful soft tissue mass base of the proximal phalanx left foot.  Also has a trite hypertrophic medial condyle of the head of the first metatarsal with painful internal fixation.  Plan: Discussed etiology pathology conservative versus surgical therapies at this point we are planning to remove the screws from the head of the first metatarsal recontour the head of the first metatarsal and then excision of the small lesion subcutaneous lesion beneath the proximal phalanx of the left foot.  We did discuss the possible risks complications and side effects of surgery she understands and is amenable to it.  We did discuss possible postop  complications which may include but not limited to postop pain bleeding swelling infection recurrence need for further surgery overcorrection under correction also digit loss of limb loss of life.  Follow-up with her in the near future for surgical evaluation.

## 2021-05-03 ENCOUNTER — Telehealth: Payer: Self-pay | Admitting: *Deleted

## 2021-05-03 NOTE — Telephone Encounter (Signed)
Patient will come into the [facility] office to exchange the smaller boot for a larger size.

## 2021-05-03 NOTE — Telephone Encounter (Signed)
Patient is calling and will be returning her shoes, too small.

## 2021-05-18 ENCOUNTER — Telehealth: Payer: Self-pay | Admitting: Podiatry

## 2021-05-18 NOTE — Telephone Encounter (Signed)
Patient is requesting a copy of her CT scan results. She would like a CD. Please Advise

## 2021-08-03 ENCOUNTER — Ambulatory Visit (INDEPENDENT_AMBULATORY_CARE_PROVIDER_SITE_OTHER): Payer: Medicare Other | Admitting: Podiatry

## 2021-08-03 ENCOUNTER — Other Ambulatory Visit: Payer: Self-pay

## 2021-08-03 ENCOUNTER — Encounter: Payer: Self-pay | Admitting: Podiatry

## 2021-08-03 DIAGNOSIS — G5762 Lesion of plantar nerve, left lower limb: Secondary | ICD-10-CM

## 2021-08-03 DIAGNOSIS — M2012 Hallux valgus (acquired), left foot: Secondary | ICD-10-CM

## 2021-08-03 DIAGNOSIS — T847XXD Infection and inflammatory reaction due to other internal orthopedic prosthetic devices, implants and grafts, subsequent encounter: Secondary | ICD-10-CM

## 2021-08-03 DIAGNOSIS — M25872 Other specified joint disorders, left ankle and foot: Secondary | ICD-10-CM | POA: Diagnosis not present

## 2021-08-03 DIAGNOSIS — M19072 Primary osteoarthritis, left ankle and foot: Secondary | ICD-10-CM

## 2021-08-03 NOTE — Progress Notes (Signed)
She presents today to discuss surgery regarding her left foot.  She states that she would like to go ahead and set up time to get this left foot corrected.  She states that she still has severe tenderness on the plantar aspect of the foot and the bunion is still bothering her.  She also relates pain to the posterior aspect of the heel which she has mentioned a couple times previously.  Objective: Vital signs are stable she is alert oriented x3 have reviewed her past medical history medications allergies surgery social history.  She states that she is no longer taking the Eliquis.  She states that she is only taking blood pressure medication at this time.  Pulses are palpable left she is has a great range of motion of the first metatarsophalangeal joint though it is still moderately tender on the plantar surface.  Radiographs and CT scan were reviewed with the patient today and discussed in great detail demonstrating that there is a tip in the head of the metatarsal with the shifting of the screws.  I also demonstrated to her the severe osteoarthritic changes of the tibial sesamoid bone to bone contact she understands this and is amenable to it understands that this may be part of her problem.  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  Also painful internal fixation to the hallux which is stable.  She also has some mild posterior heel pain associated with 30 heel spur back there.  Plan: We discussed in great detail today the surgical correction of this which is only consist of a Weil type osteotomy with excision of the tibial sesamoid.  We will also try to remove all of the internal fixation and correct the angle of the joint.  We will also perform a PRP injection to the distal Achilles of the left foot.  We did discuss the possible postop complications which may include but not limited to postop pain bleeding swelling  infection recurrence need further surgery overcorrection under correction also digit loss of limb loss of life failure of the surgery to improve her conditions.  Provide her with information regarding the surgery center anesthesia group she has been there before she understands this is amendable to it I will follow-up with her in the new year for surgical intervention.  She will notify us with any changes in her health.

## 2021-08-05 ENCOUNTER — Encounter: Payer: Self-pay | Admitting: Podiatry

## 2021-08-10 ENCOUNTER — Other Ambulatory Visit: Payer: Self-pay | Admitting: Obstetrics and Gynecology

## 2021-08-10 DIAGNOSIS — Z1231 Encounter for screening mammogram for malignant neoplasm of breast: Secondary | ICD-10-CM

## 2021-08-24 ENCOUNTER — Telehealth: Payer: Self-pay | Admitting: Urology

## 2021-08-24 NOTE — Telephone Encounter (Signed)
Pt called to cancel her sx and she will call back to reschedule. Pt doesn't have anyone that is able to come stay with her and help her after sx. Her family is [place] seas and when someone is able to come over here she will call back to reschedule. I have informed Shelly, Dr. Milinda Pointer and Caren Griffins at [facility] of this change.

## 2021-09-13 ENCOUNTER — Ambulatory Visit
Admission: RE | Admit: 2021-09-13 | Discharge: 2021-09-13 | Disposition: A | Discharge status: Home / Self Care | Payer: Medicare Other | Source: Ambulatory Visit | Attending: Obstetrics and Gynecology | Admitting: Obstetrics and Gynecology

## 2021-09-13 DIAGNOSIS — Z1231 Encounter for screening mammogram for malignant neoplasm of breast: Secondary | ICD-10-CM

## 2021-10-14 ENCOUNTER — Encounter: Payer: Medicare Other | Admitting: Podiatry

## 2021-10-19 ENCOUNTER — Encounter: Payer: Medicare Other | Admitting: Podiatry

## 2021-11-02 ENCOUNTER — Encounter: Payer: Medicare Other | Admitting: Podiatry

## 2021-11-16 ENCOUNTER — Encounter: Payer: Medicare Other | Admitting: Podiatry

## 2021-12-10 ENCOUNTER — Telehealth: Payer: Self-pay | Admitting: Podiatry

## 2021-12-10 NOTE — Telephone Encounter (Signed)
Pt states that she is having a lot of pain and swelling in her toe and cannot put on a shoe. She is scheduled for sx on 01/21/22. She wants to know if something can be done sooner.  ? ?Please advise ?

## 2021-12-15 ENCOUNTER — Other Ambulatory Visit: Payer: Self-pay

## 2021-12-15 ENCOUNTER — Encounter: Payer: Self-pay | Admitting: Podiatry

## 2021-12-15 ENCOUNTER — Ambulatory Visit (INDEPENDENT_AMBULATORY_CARE_PROVIDER_SITE_OTHER): Payer: Medicare Other | Admitting: Podiatry

## 2021-12-15 DIAGNOSIS — M2012 Hallux valgus (acquired), left foot: Secondary | ICD-10-CM

## 2021-12-15 DIAGNOSIS — M19072 Primary osteoarthritis, left ankle and foot: Secondary | ICD-10-CM | POA: Diagnosis not present

## 2021-12-15 DIAGNOSIS — M778 Other enthesopathies, not elsewhere classified: Secondary | ICD-10-CM

## 2021-12-15 NOTE — Progress Notes (Signed)
She presents today for follow-up of her bunion left foot she states that she is scheduled for surgery on April 28 with having trouble finding someone to stay with her that she would like to talk about moving the surgery up. ? ?Objective: Vital signs are stable she is alert and oriented x3.  Pulses are palpable.  She still has pain about the first metatarsophalangeal joint of the left foot.  No changes on physical exam. ? ?Assessment: Osteoarthritis first metatarsophalangeal joint left foot. ? ?Plan: She asked for an injection I declined basically stating that if she was going to have it done less than a month I would rather not put an injection in the joint area.  She understands and is amendable to it.  We are however going to make sure that she discontinues her Eliquis 3 days in advance of the surgery and they were going to also do a vitamin D level CMP and a hemoglobin hematocrit.  I will notify her with these results.  Recommended that she have them done a little closer to the time of surgery. ?

## 2021-12-31 ENCOUNTER — Other Ambulatory Visit: Payer: Self-pay | Admitting: Podiatry

## 2022-01-01 LAB — CBC WITH DIFFERENTIAL/PLATELET
Absolute Monocytes: 477 cells/uL (ref 200–950)
Basophils Absolute: 54 cells/uL (ref 0–200)
Basophils Relative: 0.6 %
Eosinophils Absolute: 144 cells/uL (ref 15–500)
Eosinophils Relative: 1.6 %
HCT: 36.3 % (ref 35.0–45.0)
Hemoglobin: 11.9 g/dL (ref 11.7–15.5)
Lymphs Abs: 3636 cells/uL (ref 850–3900)
MCH: 29 pg (ref 27.0–33.0)
MCHC: 32.8 g/dL (ref 32.0–36.0)
MCV: 88.3 fL (ref 80.0–100.0)
MPV: 11.7 fL (ref 7.5–12.5)
Monocytes Relative: 5.3 %
Neutro Abs: 4689 cells/uL (ref 1500–7800)
Neutrophils Relative %: 52.1 %
Platelets: 261 10*3/uL (ref 140–400)
RBC: 4.11 10*6/uL (ref 3.80–5.10)
RDW: 12.3 % (ref 11.0–15.0)
Total Lymphocyte: 40.4 %
WBC: 9 10*3/uL (ref 3.8–10.8)

## 2022-01-01 LAB — COMPLETE METABOLIC PANEL WITH GFR
AG Ratio: 1.6 (calc) (ref 1.0–2.5)
ALT: 11 U/L (ref 6–29)
AST: 13 U/L (ref 10–35)
Albumin: 4.4 g/dL (ref 3.6–5.1)
Alkaline phosphatase (APISO): 71 U/L (ref 37–153)
BUN: 16 mg/dL (ref 7–25)
CO2: 26 mmol/L (ref 20–32)
Calcium: 9.8 mg/dL (ref 8.6–10.4)
Chloride: 103 mmol/L (ref 98–110)
Creat: 0.81 mg/dL (ref 0.60–1.00)
Globulin: 2.7 g/dL (calc) (ref 1.9–3.7)
Glucose, Bld: 101 mg/dL — ABNORMAL HIGH (ref 65–99)
Potassium: 4.6 mmol/L (ref 3.5–5.3)
Sodium: 139 mmol/L (ref 135–146)
Total Bilirubin: 0.7 mg/dL (ref 0.2–1.2)
Total Protein: 7.1 g/dL (ref 6.1–8.1)
eGFR: 76 mL/min/{1.73_m2} (ref >=60)

## 2022-01-01 LAB — VITAMIN D 25 HYDROXY (VIT D DEFICIENCY, FRACTURES): Vit D, 25-Hydroxy: 75 ng/mL (ref 30–100)

## 2022-01-05 ENCOUNTER — Telehealth: Payer: Self-pay | Admitting: Podiatry

## 2022-01-05 NOTE — Telephone Encounter (Signed)
Patient called and stated that she needed antibiotic and pain meds soon, she stated her sx is 01/14/2022. Patient stated she has transportation issues. ?

## 2022-01-05 NOTE — Telephone Encounter (Signed)
Pt called wanting to know when she should expect to receive her antibiotic and pain meds for her procedure.  ?Please advise.  ?

## 2022-01-12 ENCOUNTER — Other Ambulatory Visit: Payer: Self-pay | Admitting: Podiatry

## 2022-01-12 MED ORDER — ONDANSETRON HCL 4 MG PO TABS: 4.0000 mg | ORAL_TABLET | Freq: Three times a day (TID) | ORAL | 0 refills | Status: DC | PRN

## 2022-01-12 MED ORDER — CEPHALEXIN 500 MG PO CAPS: 500.0000 mg | ORAL_CAPSULE | Freq: Three times a day (TID) | ORAL | 0 refills | Status: DC

## 2022-01-12 MED ORDER — OXYCODONE-ACETAMINOPHEN 10-325 MG PO TABS: 1.0000 | ORAL_TABLET | Freq: Three times a day (TID) | ORAL | 0 refills | Status: AC | PRN

## 2022-01-13 ENCOUNTER — Other Ambulatory Visit (HOSPITAL_COMMUNITY): Payer: Self-pay

## 2022-01-13 ENCOUNTER — Other Ambulatory Visit: Payer: Self-pay | Admitting: Podiatry

## 2022-01-13 ENCOUNTER — Telehealth: Payer: Self-pay | Admitting: *Deleted

## 2022-01-13 MED ORDER — OXYCODONE-ACETAMINOPHEN 10-325 MG PO TABS
1.0000 | ORAL_TABLET | Freq: Three times a day (TID) | ORAL | 0 refills | Status: AC | PRN
  Filled 2022-01-13: qty 21, 7d supply, fill #0

## 2022-01-13 NOTE — Telephone Encounter (Signed)
Patient is calling to ask that her prescription of Oxycodone be resent to [facility] outpatient, [facility] is currently out of medicine. Please advise. ?

## 2022-01-14 DIAGNOSIS — M205X2 Other deformities of toe(s) (acquired), left foot: Secondary | ICD-10-CM

## 2022-01-14 DIAGNOSIS — M25872 Other specified joint disorders, left ankle and foot: Secondary | ICD-10-CM | POA: Diagnosis not present

## 2022-01-14 NOTE — Telephone Encounter (Signed)
Patient notified thru vmessage that medication has been resent.

## 2022-01-18 ENCOUNTER — Telehealth: Payer: Self-pay | Admitting: Podiatry

## 2022-01-18 NOTE — Telephone Encounter (Signed)
Pt states she cannot keep boot on and her foot is swollen and can hardly walk. Pt wants to know what can she do until she is able to come to her post op appt this Thurs. ? ?Please advise? ?

## 2022-01-19 NOTE — Telephone Encounter (Signed)
Returned call to patient and gave instructions and recommendations per Dr. Milinda Pointer.  She stated that she is wearing the boot when walking.   She verbalized instructions and understanding ?

## 2022-01-20 ENCOUNTER — Encounter: Payer: Self-pay | Admitting: Podiatry

## 2022-01-20 ENCOUNTER — Ambulatory Visit (INDEPENDENT_AMBULATORY_CARE_PROVIDER_SITE_OTHER): Payer: Medicare Other | Admitting: Podiatry

## 2022-01-20 ENCOUNTER — Ambulatory Visit (INDEPENDENT_AMBULATORY_CARE_PROVIDER_SITE_OTHER): Payer: Medicare Other

## 2022-01-20 DIAGNOSIS — M25872 Other specified joint disorders, left ankle and foot: Secondary | ICD-10-CM

## 2022-01-20 DIAGNOSIS — M205X2 Other deformities of toe(s) (acquired), left foot: Secondary | ICD-10-CM

## 2022-01-20 DIAGNOSIS — T847XXD Infection and inflammatory reaction due to other internal orthopedic prosthetic devices, implants and grafts, subsequent encounter: Secondary | ICD-10-CM

## 2022-01-20 DIAGNOSIS — Z9889 Other specified postprocedural states: Secondary | ICD-10-CM

## 2022-01-20 NOTE — Progress Notes (Signed)
She presents today for her first postop visit status post Keller arthroplasty bunionectomy sesamoidectomy that we did have to convert intraoperatively.  She states that she does not like taking the oxycodone makes her feel weird so she would like to take ibuprofen and Tylenol.  I recommended that she just take the Tylenol. ? ?Objective: Vital signs are stable tellurian x3 dressed her dressing intact once removed demonstrates foot is rectus there is no signs of infection incision site is healing very nicely.  She still has exquisite tenderness on the plantar medial aspect of the first metatarsal phalangeal joint this has to be neurological. ? ?Radiographs taken today demonstrate a Keller arthroplasty single silicone implant. ? ?Assessment: Well-healing surgical foot retaining pain postoperatively plantar medial aspect of the first metatarsophalangeal joint. ? ?Plan: Redressed today dressed a compressive dressing encouraged her to move her toes to keep the joint immobilized and I will follow-up with her in a week. ?

## 2022-01-21 ENCOUNTER — Telehealth: Payer: Self-pay | Admitting: Podiatry

## 2022-01-21 NOTE — Telephone Encounter (Signed)
Rescheduled pt to see Dr Sherryle Lis and pt asked if ok to keep ice on foot but not directly on the skin and ok per Dr Stephenie Acres assistant. ?

## 2022-01-27 ENCOUNTER — Encounter: Payer: Self-pay | Admitting: Podiatry

## 2022-01-27 ENCOUNTER — Encounter: Payer: Medicare Other | Admitting: Podiatry

## 2022-01-27 ENCOUNTER — Ambulatory Visit (INDEPENDENT_AMBULATORY_CARE_PROVIDER_SITE_OTHER): Payer: Medicare Other | Admitting: Podiatry

## 2022-01-27 DIAGNOSIS — Z9889 Other specified postprocedural states: Secondary | ICD-10-CM

## 2022-01-27 DIAGNOSIS — M205X2 Other deformities of toe(s) (acquired), left foot: Secondary | ICD-10-CM

## 2022-01-27 DIAGNOSIS — M25872 Other specified joint disorders, left ankle and foot: Secondary | ICD-10-CM

## 2022-01-27 MED ORDER — FLUOCINONIDE EMULSIFIED BASE 0.05 % EX CREA: 1.0000 "application " | TOPICAL_CREAM | Freq: Every day | CUTANEOUS | 0 refills | Status: DC

## 2022-01-31 NOTE — Progress Notes (Signed)
?  Subjective:  ?Patient ID: Melissa Brennan, female    DOB: [birth year],  MRN: [id number] ? ?Chief Complaint  ?Patient presents with  ? Routine Post Op  ?    POV #2 DOS 01/14/2022 KELLER BUNIONECTOMY, SESAMOIDECTOMY,  PRP INJ LT- sutures intact- no pain- denies any f/n/v/c/sob/cp.   ? ? ? ?76 y.o. female returns for post-op check.  Doing well not having much pain ? ?Review of Systems: Negative except as noted in the HPI. Denies N/V/F/Ch. ? ? ?Objective:  ?There were no vitals filed for this visit. ?There is no height or weight on file to calculate BMI. ?Constitutional Well developed. ?Well nourished.  ?Vascular Foot warm and well perfused. ?Capillary refill normal to all digits.  Calf is soft and supple, no posterior calf or knee pain, negative Homans' sign  ?Neurologic Normal speech. ?Oriented to person, place, and time. ?Epicritic sensation to light touch grossly present bilaterally.  ?Dermatologic  Skin edges well coapted without signs of infection.  Small area of delayed healing in the distal midportion of the incision there is no dehiscence.  Dorsal abrasion midfoot that is pruritic  ?Orthopedic: Tenderness to palpation noted about the surgical site.  ? ?Assessment:  ? ?1. Hallux limitus of left foot   ?2. Sesamoiditis of left foot   ?3. Post-operative state   ? ?Plan:  ?Patient was evaluated and treated and all questions answered. ? ?S/p foot surgery left ?-Progressing as expected post-operatively. ?-Continue WBAT in the surgical shoe.  Sutures were removed today.  She has an abrasion on the dorsal foot, I prescribed her Lidex cream for this.  Advised to apply Neosporin to the remainder of the incision.  There is no dehiscence.  No signs of deep infection.  She will follow-up in 1 week for follow-up with Dr. Milinda Pointer. ? ?Return in about 1 week (around 02/03/2022) for post op (no x-rays).  ?

## 2022-02-03 ENCOUNTER — Encounter: Payer: Medicare Other | Admitting: Podiatry

## 2022-02-04 ENCOUNTER — Telehealth: Payer: Self-pay | Admitting: Podiatry

## 2022-02-04 NOTE — Telephone Encounter (Signed)
Pt is having complications with her boot saying its too heavy to wear and wants a different size. ? ?Please advise ? ? ?

## 2022-02-10 ENCOUNTER — Ambulatory Visit (INDEPENDENT_AMBULATORY_CARE_PROVIDER_SITE_OTHER): Payer: Medicare Other | Admitting: Podiatry

## 2022-02-10 ENCOUNTER — Ambulatory Visit: Payer: Medicare Other

## 2022-02-10 ENCOUNTER — Ambulatory Visit (INDEPENDENT_AMBULATORY_CARE_PROVIDER_SITE_OTHER): Payer: Medicare Other

## 2022-02-10 DIAGNOSIS — Z9889 Other specified postprocedural states: Secondary | ICD-10-CM

## 2022-02-10 DIAGNOSIS — M205X2 Other deformities of toe(s) (acquired), left foot: Secondary | ICD-10-CM

## 2022-02-10 DIAGNOSIS — M25872 Other specified joint disorders, left ankle and foot: Secondary | ICD-10-CM

## 2022-02-10 NOTE — Progress Notes (Signed)
She presents today for postop visit date of surgery 01/14/2022 status post [facility] arthroplasty with single silicone implant and sesamoidectomy.  She states that she is doing a lot better no pain at this time she is washing the area and applying Neosporin.  Objective: Vital signs are stable she is alert and oriented x3.  Pulses are palpable there is no edema distal mild erythema no cellulitis drainage or odor skin great range of motion of the first metatarsophalangeal joint still has her area of pinpoint pit pain to the plantar aspect of the foot but we will address that once the wound is healed.  Assessment: Well-healing surgical foot.  Plan: I redressed it with a light dressing today and allow her to start utilizing a Darco shoe.  Follow-up with me in 2 weeks.

## 2022-02-15 ENCOUNTER — Encounter: Payer: Medicare Other | Admitting: Podiatry

## 2022-02-22 ENCOUNTER — Telehealth: Payer: Self-pay | Admitting: *Deleted

## 2022-02-22 NOTE — Telephone Encounter (Signed)
Patient is wanting to know if she may get her nails trimmed at her upcoming appointment,does not go nail salons, afraid she may get a fungus. Please advise.

## 2022-02-23 NOTE — Telephone Encounter (Signed)
Spoke with patient to see if she wants routine maintenance,does not want ongoing, just a one time nail trim

## 2022-02-23 NOTE — Telephone Encounter (Signed)
If she wants this service as an ongoing maintenance, we should probably get her set up with Dr. Elisha Ponder or Dr. Prudence Davidson.

## 2022-02-24 ENCOUNTER — Ambulatory Visit (INDEPENDENT_AMBULATORY_CARE_PROVIDER_SITE_OTHER): Payer: Medicare Other | Admitting: Podiatry

## 2022-02-24 DIAGNOSIS — M79676 Pain in unspecified toe(s): Secondary | ICD-10-CM | POA: Diagnosis not present

## 2022-02-24 DIAGNOSIS — M205X2 Other deformities of toe(s) (acquired), left foot: Secondary | ICD-10-CM

## 2022-02-24 DIAGNOSIS — Z9889 Other specified postprocedural states: Secondary | ICD-10-CM

## 2022-02-24 DIAGNOSIS — B351 Tinea unguium: Secondary | ICD-10-CM | POA: Diagnosis not present

## 2022-02-24 DIAGNOSIS — M25872 Other specified joint disorders, left ankle and foot: Secondary | ICD-10-CM

## 2022-02-24 MED ORDER — METHYLPREDNISOLONE 4 MG PO TBPK: ORAL_TABLET | ORAL | 0 refills | Status: DC

## 2022-02-26 ENCOUNTER — Telehealth: Payer: Self-pay | Admitting: Podiatry

## 2022-02-26 NOTE — Progress Notes (Signed)
She presents today for a postop visit date of surgery 01/14/2022 Melissa Brennan bunionectomy sesamoidectomy she states that she is complaining of constant sharp pain in the ball of her foot states that it started a few days ago and she has been taking Tylenol for it.  She feels that it may be coming from her Darco shoe.  Requesting a nail trim.  Objective: Vital signs are stable she is alert and oriented x3 there is more swelling in the toe this visit than there was last that she has great range of motion at the first metatarsal phalangeal joint.  She does have some tenderness in the lesser metatarsals.  There is no erythema cellulitis drainage or odor.  Toenails are thick yellow dystrophic elongated sharply incurvated and painful on palpation.  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  Pain limb secondary to onychomycosis.  Plan: I will be willing to let her back in her regular shoe and start her on methylprednisolone.  I also debrided her nails 1 through 5 bilaterally.

## 2022-02-26 NOTE — Telephone Encounter (Signed)
Patient called the answering service at 11:30am today stating she started the steroid and now she has rash on her face.  She denies any chest pain, shortness of breath or difficulty breathing.  I advised her to start taking Benadryl.  Stop the medication.  If there is any worsening she is to go to the hospital but she states that she is doing well otherwise.  Encouraged to call back with any questions or changes.  I told her I would send a message to Dr. Milinda Pointer to see if you want to prescribe any other medications next week.  She verbalized understanding and had no further questions or concerns.

## 2022-03-01 ENCOUNTER — Encounter: Payer: Medicare Other | Admitting: Podiatry

## 2022-03-31 ENCOUNTER — Ambulatory Visit (INDEPENDENT_AMBULATORY_CARE_PROVIDER_SITE_OTHER): Payer: Medicare Other

## 2022-03-31 ENCOUNTER — Encounter: Payer: Self-pay | Admitting: Podiatry

## 2022-03-31 ENCOUNTER — Ambulatory Visit (INDEPENDENT_AMBULATORY_CARE_PROVIDER_SITE_OTHER): Payer: Medicare Other | Admitting: Podiatry

## 2022-03-31 DIAGNOSIS — Z9889 Other specified postprocedural states: Secondary | ICD-10-CM

## 2022-03-31 DIAGNOSIS — G5762 Lesion of plantar nerve, left lower limb: Secondary | ICD-10-CM

## 2022-03-31 DIAGNOSIS — M205X2 Other deformities of toe(s) (acquired), left foot: Secondary | ICD-10-CM | POA: Diagnosis not present

## 2022-03-31 DIAGNOSIS — M25872 Other specified joint disorders, left ankle and foot: Secondary | ICD-10-CM

## 2022-03-31 DIAGNOSIS — D3610 Benign neoplasm of peripheral nerves and autonomic nervous system, unspecified: Secondary | ICD-10-CM

## 2022-03-31 NOTE — Progress Notes (Signed)
She presents today date of surgery 01/14/2022 Jake Michaelis bunionectomy with sesamoidectomy PRP injection she states that it hurts really bad on the bottom of the toe turns up.  Objective: Vital signs are stable she is alert and oriented x3 there is no erythema cellulitis drainage or odor she does have some edema overlying the dorsal aspect of the foot but radiographs do not demonstrate any type of fractures.  A the implant is in good position.  She does have a mild extended hallux at the IP joint but no more so than that of her right foot.  Left foot is stiff at the level of the first metatarsal phalangeal joint she still has that painful nerve on the plantar medial aspect of the first metatarsal phalangeal joint area.  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  She also has limited range of motion status post Keller arthroplasty.  Plan: Send her to physical therapy demonstrated some physical therapy to her.  Also injected the day today her first dose of dehydrated alcohol to the painful area.

## 2022-04-04 ENCOUNTER — Telehealth: Payer: Self-pay | Admitting: *Deleted

## 2022-04-04 NOTE — Telephone Encounter (Signed)
"  Dr. Milinda Pointer is my doctor.  I need therapy for my feet.  If you send me to [facility], I don't mind.  Please call me back, I have a problem with my feet."

## 2022-04-05 ENCOUNTER — Telehealth: Payer: Self-pay | Admitting: Podiatry

## 2022-04-05 NOTE — Telephone Encounter (Signed)
Patient came into office today tues 7/11 and would like to be referred to physical therapy.

## 2022-04-07 NOTE — Telephone Encounter (Signed)
Order was place for PT on 03/31/22

## 2022-04-12 NOTE — Telephone Encounter (Signed)
Patient has been scheduled 04/14/22-[facility],upstairs

## 2022-04-12 NOTE — Telephone Encounter (Signed)
Ok thanks Ammie!

## 2022-04-12 NOTE — Telephone Encounter (Signed)
Called [facility] (upstairs-[facility]), have received referral for PT,patient has been scheduled for 04/14/22'@8'$ :00am

## 2022-04-28 ENCOUNTER — Encounter: Payer: Self-pay | Admitting: Podiatry

## 2022-04-28 ENCOUNTER — Ambulatory Visit (INDEPENDENT_AMBULATORY_CARE_PROVIDER_SITE_OTHER): Payer: Medicare Other | Admitting: Podiatry

## 2022-04-28 DIAGNOSIS — G5792 Unspecified mononeuropathy of left lower limb: Secondary | ICD-10-CM

## 2022-04-30 NOTE — Progress Notes (Signed)
She presents today for follow-up of her neuritis she also presents for follow-up of her calor.  She states that the toe is stiff and she does not understand why the DIPJ is starting to elevate or dorsiflex.  She states that she still has the same sensation under the ball of the foot that she has had for the past 30 years.  She is also concerned about the scar and how thick it is becoming.  Objective: Vital signs are stable alert oriented x3.  Pulses are palpable.  She still has that very small area that appears to be a cutaneous neuroma or sitting in corpuscle issue that is exquisitely painful just beneath the metatarsal phalangeal joint.  It is not painful on deep palpation it is pretty much just the skin.  She does have limited range of motion of the first metatarsophalangeal joint and has been going to physical therapy.  She appears to have more range of motion than she previously had.  The scar does appear to be thickened but appears to be improving with time.  No open lesions or wounds are noted.  Assessment: Painful neurological issue plantar aspect forefoot left.  Well-healing surgical foot though scar tissue is limiting range of motion.  Plan: Discussed etiology pathology and surgical therapies I recommended that she Inc[facility] continue her physical therapy and encouraged her for specific ranges of motion of the metatarsal phalangeal joint and not the interphalangeal joint she understands this and is amenable to it.  I discussed giving her another dehydrated alcohol injection however she did not want to do that stating that she had already had 1 by Dr. Posey Pronto and it did not work I expressed to her that that was a cortisone shot and that there is shots that we had started last time was specific for the nerve but she wanted to see neurology instead states that she had never seen neurology for this and thinks it may have some significant bearing by seeing a specialist of that sort.  She is leaving for  [place] and will be gone until the end of next month and I will follow-up with her at that time.

## 2022-06-10 ENCOUNTER — Telehealth: Payer: Self-pay | Admitting: Podiatry

## 2022-06-10 NOTE — Telephone Encounter (Signed)
Looks like they have tried to call her 4 different times and left messages. Will you call her and give her [facility] phone number and she can call and schedule it.

## 2022-06-10 NOTE — Telephone Encounter (Signed)
Patient came into [place] office stating she had gotten a referral for neurology from Dr Milinda Pointer and she has not heard from them yet.

## 2022-06-23 ENCOUNTER — Ambulatory Visit (INDEPENDENT_AMBULATORY_CARE_PROVIDER_SITE_OTHER): Payer: Medicare Other

## 2022-06-23 ENCOUNTER — Ambulatory Visit (INDEPENDENT_AMBULATORY_CARE_PROVIDER_SITE_OTHER): Payer: Medicare Other | Admitting: Orthopaedic Surgery

## 2022-06-23 ENCOUNTER — Ambulatory Visit: Payer: Medicare Other

## 2022-06-23 VITALS — BP 123/72 | HR 71 | Wt 116.0 lb

## 2022-06-23 DIAGNOSIS — M1611 Unilateral primary osteoarthritis, right hip: Secondary | ICD-10-CM

## 2022-06-23 MED ORDER — LIDOCAINE HCL 1 % IJ SOLN
4.0000 mL | INTRAMUSCULAR | Status: AC | PRN
  Administered 2022-06-23: 4 mL

## 2022-06-23 MED ORDER — METHYLPREDNISOLONE ACETATE 40 MG/ML IJ SUSP
80.0000 mg | INTRAMUSCULAR | Status: AC | PRN
  Administered 2022-06-23: 80 mg via INTRA_ARTICULAR

## 2022-06-23 NOTE — Progress Notes (Signed)
Office Visit Note   Patient: Melissa Brennan           Date of Birth: [birth year]           MRN: [id number] Visit Date: 06/23/2022              Requested by: Charolette Forward, MD [phone] [address] [place],  [place] [address] PCP: Charolette Forward, MD   Assessment & Plan: Visit Diagnoses:  1. Primary osteoarthritis of right hip     Plan: Impression is advanced degenerative joint disease right hip.  We will set her up with another injection today with Dr. Rolena Infante.  Hopefully she will continue to get good relief from these injections.  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  This may impact complexity of surgery.  Right now she is still recovering from foot surgery and some other medical issues.  We will see her back as needed.  Follow-Up Instructions: No follow-ups on file.   Orders:  Orders Placed This Encounter  Procedures   Large Joint Inj: R hip joint   XR HIP UNILAT W OR W/O PELVIS 2-3 VIEWS RIGHT   US Guided Needle Placement - No Linked Charges   No orders of the defined types were placed in this encounter.     Procedures: No procedures performed   Clinical Data: No additional findings.   Subjective: Chief Complaint  Patient presents with   Right Leg - Pain    HPI Patient is a 76 year old female who comes in for right hip and thigh pain.  Underwent ultrasound guided intra-articular right hip injection about a year and a half ago by Dr. Junius Roads.  She felt significant improvement from this injection.  She would like to try another one as the symptoms have returned.  Review of Systems   Objective: Vital Signs: BP 123/72   Pulse 71   Wt 116 lb (52.6 kg)   BMI 19.30 kg/m   Physical Exam  Ortho Exam Examination of right hip shows pain with internal/external rotation and logroll.  Pain with hip flexion past 50 degrees.  There is no trochanteric tenderness.  Specialty Comments:   No specialty comments available.  Imaging: XR HIP UNILAT W OR W/O PELVIS 2-3 VIEWS RIGHT  Result Date: 06/23/2022 Advanced right hip degenerative joint disease.  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    PMFS History: Patient Active Problem List   Diagnosis Date Noted   Primary osteoarthritis of right hip 06/23/2022   Chronic GI bleeding 06/21/2019   Past Medical History:  Diagnosis Date   A-fib [facility])    Hypertension     No family history on file.  Past Surgical History:  Procedure Laterality Date   ENTEROSCOPY N/A 02/12/2021   Procedure: ENTEROSCOPY;  Surgeon: Carol Ada, MD;  Location: [facility] ENDOSCOPY;  Service: Endoscopy;  Laterality: N/A;   ESOPHAGOGASTRODUODENOSCOPY (EGD) WITH PROPOFOL N/A 07/26/2019   Procedure: ESOPHAGOGASTRODUODENOSCOPY (EGD) WITH PROPOFOL;  Surgeon: Carol Ada, MD;  Location: [facility] ENDOSCOPY;  Service: Endoscopy;  Laterality: N/A;   HOT HEMOSTASIS N/A 07/26/2019   Procedure: HOT HEMOSTASIS (ARGON PLASMA COAGULATION/BICAP);  Surgeon: Carol Ada, MD;  Location: [facility];  Service: Endoscopy;  Laterality: N/A;   Social History   Occupational History   Not on file  Tobacco Use   Smoking status: Never   Smokeless tobacco: Never  Vaping Use   Vaping Use:  Never used  Substance and Sexual Activity   Alcohol use: No   Drug use: No   Sexual activity: Not on file

## 2022-06-23 NOTE — Progress Notes (Signed)
     Procedure Note  Patient: Melissa Brennan             Date of Birth: Sep 23, 1946           MRN: [id number]             Visit Date: 06/23/2022  Procedures: Visit Diagnoses:  1. Arthritis of right hip     Large Joint Inj: R hip joint on 06/23/2022 10:47 AM Indications: pain Details: 22 G 3.5 in needle, ultrasound-guided anterior approach  Arthrogram: No  Medications: 4 mL lidocaine 1 %; 80 mg methylPREDNISolone acetate 40 MG/ML  Procedure: US-guided intra-articular hip injection, Right After discussion on risks/benefits/indications and informed verbal consent was obtained, a timeout was performed. Patient was lying supine on exam table. The hip was cleaned with betadine and alcohol swabs. Then utilizing ultrasound guidance, the patient's femoral head and neck junction was identified and subsequently injected with 4:2 lidocaine:depomedrol via an in-plane approach with ultrasound visualization of the injectate administered into the hip joint. Patient tolerated procedure well without immediate complications.  Consent was given by the patient. Immediately prior to procedure a time out was called to verify the correct patient, procedure, equipment, support staff and site/side marked as required. Patient was prepped and draped in the usual sterile fashion.    - I evaluated the patient about 10 minutes post-injection and they had improvement in pain and range of motion - follow-up with Dr. Erlinda Hong as indicated; I am happy to see them as needed  Elba Barman, DO [place]  This note was dictated using Dragon naturally speaking software and may contain errors in syntax, spelling, or content which have not been identified prior to signing this note.

## 2022-07-25 ENCOUNTER — Ambulatory Visit: Payer: Medicare Other | Admitting: Diagnostic Neuroimaging

## 2022-08-04 ENCOUNTER — Encounter: Payer: Self-pay | Admitting: Podiatry

## 2022-08-04 ENCOUNTER — Ambulatory Visit (INDEPENDENT_AMBULATORY_CARE_PROVIDER_SITE_OTHER): Payer: Medicare Other | Admitting: Podiatry

## 2022-08-04 DIAGNOSIS — G5792 Unspecified mononeuropathy of left lower limb: Secondary | ICD-10-CM

## 2022-08-04 DIAGNOSIS — L603 Nail dystrophy: Secondary | ICD-10-CM | POA: Diagnosis not present

## 2022-08-04 DIAGNOSIS — M79676 Pain in unspecified toe(s): Secondary | ICD-10-CM | POA: Diagnosis not present

## 2022-08-04 DIAGNOSIS — K5900 Constipation, unspecified: Secondary | ICD-10-CM | POA: Insufficient documentation

## 2022-08-04 DIAGNOSIS — B351 Tinea unguium: Secondary | ICD-10-CM

## 2022-08-05 NOTE — Progress Notes (Signed)
She presents today for follow-up of her hallux left.  She states that she still in physical therapy to try to gain motion of the first metatarsophalangeal joint of the left foot.  She states that it does not seem to be lying down.  She is also complaining of painful elongated toenails.  Objective: Vital signs are stable she is alert and oriented x3.  Pulses are palpable.  Toenails are long thick yellow dystrophic onychomycotic she has an extended hallux interphalangeal joint secondary to stiffness of the first metatarsal phalangeal joint.  It is nontender on range of motion.  Assessment: Painfully elongated toenails.  Pain in limb secondary onychomycosis.  Stiff first metatarsophalangeal joint status post Keller arthroplasty with single silicone implant status post Fransico Him Zwick osteotomy.  Still retains painful area to the plantar aspect of the first metatarsophalangeal joint.  Plan: Discussed etiology pathology conservative versus surgical therapies at this point debrided toenails 1 through 5 bilateral she will continue physical therapy we did discuss briefly fusion of the toe which she does not want.  Follow-up with her in 3 months

## 2022-08-24 ENCOUNTER — Encounter: Payer: Self-pay | Admitting: Diagnostic Neuroimaging

## 2022-08-24 ENCOUNTER — Ambulatory Visit (INDEPENDENT_AMBULATORY_CARE_PROVIDER_SITE_OTHER): Payer: Medicare Other | Admitting: Diagnostic Neuroimaging

## 2022-08-24 VITALS — BP 115/66 | HR 75 | Ht 64.0 in | Wt 120.0 lb

## 2022-08-24 DIAGNOSIS — M79672 Pain in left foot: Secondary | ICD-10-CM

## 2022-08-24 NOTE — Progress Notes (Signed)
[facility]  PATIENT: Melissa Brennan DOB: September 23, 1946  REFERRING CLINICIAN: Hyatt, Max T, DPM HISTORY FROM: patient REASON FOR VISIT: new consult   HISTORICAL  CHIEF COMPLAINT:  Chief Complaint  Patient presents with   New Patient (Initial Visit)    Pt alone, rm 7. Pt has been have been struggling with pain in her foot. She had a bunion and the MD thought the pain was from that. She had cortisone shot, another shot (doesn't remember the name) and then had surgery 2x. 2nd surgery was to correct a screw placement This is not helping with the pain. Its one spot on the bottom of foot near big toe and you can barely touch it and it causes a sharp pain sensation.    HISTORY OF PRESENT ILLNESS:   76 year old female patient of left foot pain.  1992 patient had onset of pain on the bottom of her left foot near the first MTP joint.  She lived with this pain for many years.  Around 2020 when she finally went to podiatrist for evaluation.  She was diagnosed with sesamoiditis and hallux abductovalgus deformity.  This was treated with steroid injection without relief.  She underwent a surgery without relief.  She had a second surgery to correct screw placement but this did not change her symptoms.  She tried he had alcohol injection x 1 without relief.  Patient continues to have the same symptoms, unchanged from 1992.  At rest she does not have any pain.  She is able to flex and extend her left great toe without pain.  She only has exquisite tenderness to light touch in the area of the ball of the left foot and great toe.  Denies any issues in the proximal regions, left ankle, left knee, left hip.  No back pain.  No problems with right leg.  No problems with upper extremities.  No neck issues.  No vision, speech or swallowing issues.  No prodromal accidents injuries or traumas to the left foot.  REVIEW OF SYSTEMS: Full 14 system review of systems performed and negative with exception  of: as per HPI  ALLERGIES: Allergies  Allergen Reactions   Medrol [Methylprednisolone] Rash    HOME MEDICATIONS: Outpatient Medications Prior to Visit  Medication Sig Dispense Refill   amiodarone (PACERONE) 100 MG tablet Take 100 mg by mouth daily.     apixaban (ELIQUIS) 5 MG TABS tablet Take 5 mg by mouth 2 (two) times daily.     Cholecalciferol (VITAMIN D-3) 125 MCG (5000 UT) TABS Take 5,000 mg by mouth daily.     Multiple Vitamin (MULTIVITAMIN) tablet Take 1 tablet by mouth daily.     rosuvastatin (CRESTOR) 10 MG tablet Take 10 mg by mouth daily.     acetaminophen (TYLENOL) 325 MG tablet Take 650 mg by mouth every 6 (six) hours as needed for moderate pain or headache.     albuterol (VENTOLIN HFA) 108 (90 Base) MCG/ACT inhaler Inhale 1-2 puffs into the lungs every 6 (six) hours as needed for wheezing or shortness of breath. 1 each 0   Azilsartan-Chlorthalidone (EDARBYCLOR) 40-12.5 MG TABS Take 0.5 tablets by mouth daily.     cephALEXin (KEFLEX) 500 MG capsule Take 1 capsule (500 mg total) by mouth 3 (three) times daily. 30 capsule 0   fluocinonide-emollient (LIDEX-E) 0.05 % cream Apply 1 application. topically daily. 15 g 0   meclizine (ANTIVERT) 25 MG tablet Take 25 mg by mouth 3 (three) times daily as needed.  omeprazole (PRILOSEC) 20 MG capsule Take 20 mg by mouth daily.     ondansetron (ZOFRAN) 4 MG tablet Take 1 tablet (4 mg total) by mouth every 8 (eight) hours as needed. 20 tablet 0   promethazine (PHENERGAN) 25 MG tablet Take 12.5 mg by mouth 2 (two) times daily as needed.     No facility-administered medications prior to visit.    PAST MEDICAL HISTORY: Past Medical History:  Diagnosis Date   A-fib [facility])    Hypertension    Vertigo     PAST SURGICAL HISTORY: Past Surgical History:  Procedure Laterality Date   BUNIONECTOMY     april 2023 was the 2nd surgery   ENTEROSCOPY N/A 02/12/2021   Procedure: ENTEROSCOPY;  Surgeon: Carol Ada, MD;  Location: [facility]  ENDOSCOPY;  Service: Endoscopy;  Laterality: N/A;   ESOPHAGOGASTRODUODENOSCOPY (EGD) WITH PROPOFOL N/A 07/26/2019   Procedure: ESOPHAGOGASTRODUODENOSCOPY (EGD) WITH PROPOFOL;  Surgeon: Carol Ada, MD;  Location: [facility] ENDOSCOPY;  Service: Endoscopy;  Laterality: N/A;   HOT HEMOSTASIS N/A 07/26/2019   Procedure: HOT HEMOSTASIS (ARGON PLASMA COAGULATION/BICAP);  Surgeon: Carol Ada, MD;  Location: [facility];  Service: Endoscopy;  Laterality: N/A;    FAMILY HISTORY: No family history on file.  SOCIAL HISTORY: Social History   Socioeconomic History   Marital status: Married    Spouse name: Not on file   Number of children: Not on file   Years of education: Not on file   Highest education level: Not on file  Occupational History   Not on file  Tobacco Use   Smoking status: Never   Smokeless tobacco: Never  Vaping Use   Vaping Use: Never used  Substance and Sexual Activity   Alcohol use: No   Drug use: No   Sexual activity: Not on file  Other Topics Concern   Not on file  Social History Narrative   Not on file   Social Determinants of Health   Financial Resource Strain: Not on file  Food Insecurity: Not on file  Transportation Needs: Not on file  Physical Activity: Not on file  Stress: Not on file  Social Connections: Not on file  Intimate Partner Violence: Not on file     PHYSICAL EXAM  GENERAL EXAM/CONSTITUTIONAL: Vitals:  Vitals:   08/24/22 0910  BP: 115/66  Pulse: 75  Weight: 120 lb (54.4 kg)  Height: '5\' 4"'$  (1.626 m)   Body mass index is 20.6 kg/m. Wt Readings from Last 3 Encounters:  08/24/22 120 lb (54.4 kg)  06/23/22 116 lb (52.6 kg)  02/12/21 116 lb (52.6 kg)   Patient is in no distress; well developed, nourished and groomed; neck is supple  CARDIOVASCULAR: Examination of carotid arteries is normal; no carotid bruits Regular rate and rhythm, no murmurs Examination of peripheral vascular system by observation and palpation is  normal  EYES: Ophthalmoscopic exam of optic discs and posterior segments is normal; no papilledema or hemorrhages No results found.  MUSCULOSKELETAL: Gait, strength, tone, movements noted in Neurologic exam below  NEUROLOGIC: MENTAL STATUS:      No data to display         awake, alert, oriented to person, place and time recent and remote memory intact normal attention and concentration language fluent, comprehension intact, naming intact fund of knowledge appropriate  CRANIAL NERVE:  2nd - no papilledema on fundoscopic exam 2nd, 3rd, 4th, 6th - pupils equal and reactive to light, visual fields full to confrontation, extraocular muscles intact, no nystagmus 5th - facial sensation  symmetric 7th - facial strength symmetric 8th - hearing intact 9th - palate elevates symmetrically, uvula midline 11th - shoulder shrug symmetric 12th - tongue protrusion midline  MOTOR:  normal bulk and tone, full strength in the BUE, BLE  SENSORY:  normal and symmetric to light touch, pinprick, temperature, vibration TENDERNESS TO LIGHT TOUCH ON PLANTAR 1ST MTP REGION POST-SURG CHANGES IN LEFT 1ST MTP  COORDINATION:  finger-nose-finger, fine finger movements normal  REFLEXES:  deep tendon reflexes TRACE and symmetric  GAIT/STATION:  narrow based gait     DIAGNOSTIC DATA (LABS, IMAGING, TESTING) - I reviewed patient records, labs, notes, testing and imaging myself where available.  Lab Results  Component Value Date   WBC 9.0 12/31/2021   HGB 11.9 12/31/2021   HCT 36.3 12/31/2021   MCV 88.3 12/31/2021   PLT 261 12/31/2021      Component Value Date/Time   NA 139 12/31/2021 0827   K 4.6 12/31/2021 0827   CL 103 12/31/2021 0827   CO2 26 12/31/2021 0827   GLUCOSE 101 (H) 12/31/2021 0827   BUN 16 12/31/2021 0827   CREATININE 0.81 12/31/2021 0827   CALCIUM 9.8 12/31/2021 0827   PROT 7.1 12/31/2021 0827   ALBUMIN 4.1 11/27/2017 0646   AST 13 12/31/2021 0827   ALT 11  12/31/2021 0827   ALKPHOS 91 11/27/2017 0646   BILITOT 0.7 12/31/2021 0827   GFRNONAA >60 06/23/2019 0259   GFRAA >60 06/23/2019 0259   No results found for: "CHOL", "HDL", "LDLCALC", "LDLDIRECT", "TRIG", "CHOLHDL" No results found for: "HGBA1C" No results found for: "VITAMINB12" No results found for: "TSH"   04/20/22 CT left foot [I reviewed images myself and agree with interpretation. -VRP]  1. Postsurgical changes from previous bunion correction surgery with osteotomies of the first metatarsal and great toe proximal phalanx. Osteotomy sites appear well healed. No evidence of hardware complication. 2. Moderate osteoarthritis of the first MTP joint and mild-moderate osteoarthritis at the hallux-sesamoid complex, as described above.    ASSESSMENT AND PLAN  76 y.o. 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  Status post surgery x 2.  Status post injections x 2.   Dx:  1. Left foot pain      PLAN:  CHRONIC LEFT 1st MTP joint pain (pain with palpation / touch; possible cutaneous neuroma) - chronic, unchanging pain issue since 1992; do not suspect a different underlying systemic neurologic etiology; more likely related to cutaneous neuroma - agree with Dr. Milinda Pointer assessment and recommendation for additional nerve block / ablation - check ANA, uric acid (rule out other etiologies)  Orders Placed This Encounter  Procedures   Uric Acid   ANA,IFA RA Diag Pnl w/rflx Tit/Patn   Return for return to referring provider.  I spent 45 minutes of face-to-face and non-face-to-face time with patient.  This included previsit chart review, lab review, study review, order entry, electronic health record documentation, patient education.     Penni Bombard, MD 53/74/8270, 7:86 AM Certified in Neurology, Neurophysiology and Neuroimaging  [facility] [address]  [place], [place] [address] [phone]

## 2022-08-26 LAB — ANA,IFA RA DIAG PNL W/RFLX TIT/PATN
ANA Titer 1: NEGATIVE
Cyclic Citrullin Peptide Ab: <1 units (ref 0–19)
Rheumatoid fact SerPl-aCnc: 10 IU/mL (ref <14.0)

## 2022-08-26 LAB — URIC ACID: Uric Acid: 2.6 mg/dL — ABNORMAL LOW (ref 3.1–7.9)

## 2022-09-04 ENCOUNTER — Emergency Department (HOSPITAL_COMMUNITY): Payer: Medicare Other

## 2022-09-04 ENCOUNTER — Emergency Department (HOSPITAL_COMMUNITY)
Admission: EM | Admit: 2022-09-04 | Discharge: 2022-09-04 | Disposition: A | Discharge status: Home / Self Care | Payer: Medicare Other | Attending: Emergency Medicine | Admitting: Emergency Medicine

## 2022-09-04 DIAGNOSIS — Z1152 Encounter for screening for COVID-19: Secondary | ICD-10-CM | POA: Insufficient documentation

## 2022-09-04 DIAGNOSIS — S0990XA Unspecified injury of head, initial encounter: Secondary | ICD-10-CM | POA: Diagnosis not present

## 2022-09-04 DIAGNOSIS — R0789 Other chest pain: Secondary | ICD-10-CM | POA: Insufficient documentation

## 2022-09-04 DIAGNOSIS — H6123 Impacted cerumen, bilateral: Secondary | ICD-10-CM | POA: Insufficient documentation

## 2022-09-04 DIAGNOSIS — J069 Acute upper respiratory infection, unspecified: Secondary | ICD-10-CM | POA: Diagnosis not present

## 2022-09-04 DIAGNOSIS — E041 Nontoxic single thyroid nodule: Secondary | ICD-10-CM | POA: Insufficient documentation

## 2022-09-04 DIAGNOSIS — J029 Acute pharyngitis, unspecified: Secondary | ICD-10-CM | POA: Diagnosis not present

## 2022-09-04 DIAGNOSIS — H6121 Impacted cerumen, right ear: Secondary | ICD-10-CM

## 2022-09-04 DIAGNOSIS — W108XXA Fall (on) (from) other stairs and steps, initial encounter: Secondary | ICD-10-CM | POA: Diagnosis not present

## 2022-09-04 DIAGNOSIS — W19XXXA Unspecified fall, initial encounter: Secondary | ICD-10-CM

## 2022-09-04 LAB — RESP PANEL BY RT-PCR (RSV, FLU A&B, COVID)  RVPGX2
Influenza A by PCR: NEGATIVE
Influenza B by PCR: NEGATIVE
Resp Syncytial Virus by PCR: NEGATIVE
SARS Coronavirus 2 by RT PCR: NEGATIVE

## 2022-09-04 LAB — GROUP A STREP BY PCR: Group A Strep by PCR: NOT DETECTED

## 2022-09-04 MED ORDER — ACETAMINOPHEN 500 MG PO TABS
1000.0000 mg | ORAL_TABLET | Freq: Once | ORAL | Status: AC
  Administered 2022-09-04: 1000 mg via ORAL
  Filled 2022-09-04: qty 2

## 2022-09-04 NOTE — Discharge Instructions (Signed)
Your CT scans and x-rays do not show any signs of bleeding or fracture. It did find a thyroid nodule, please make sure to follow-up with your primary care physician about this to see if you need an ultrasound. Take Tylenol as needed for throat discomfort.

## 2022-09-04 NOTE — ED Provider Triage Note (Signed)
Emergency Medicine Provider Triage Evaluation Note  Melissa Brennan , a 76 y.o. female  was evaluated in triage.  Pt complains of head injury.  Patient fell down 2 stairs a few days ago.  Admits to hitting her head.  She is currently on Eliquis.  Admits to right-sided rib pain.  Review of Systems  Positive: Headache, rib pain Negative: fever  Physical Exam  BP 131/80 (BP Location: Right Arm)   Pulse 84   Temp 98.8 F (37.1 C)   Resp 16   SpO2 99%  Gen:   Awake, no distress   Resp:  Normal effort  MSK:   Moves extremities without difficulty  Other:  Normal speech  Medical Decision Making  Medically screening exam initiated at 11:58 AM.  Appropriate orders placed.  Melissa Brennan was informed that the remainder of the evaluation will be completed by another provider, this initial triage assessment does not replace that evaluation, and the importance of remaining in the ED until their evaluation is complete.  11:58 AM Called CT, patient brought to CT scan by RN    Suzy Bouchard, PA-C 09/04/22 1159

## 2022-09-04 NOTE — ED Triage Notes (Signed)
Patient BIB [facility] from home for evaluation of a fall that occurred yesterday when she slipped and fell down two steps. Patient complains of pain to her lower back and left sided of head, is on eliquis. Denies LOC. Patient complains of dizziness and nausea that started last night. '4mg'$  zofran, 20g saline lock in left wrist.   BP 170/92

## 2022-09-04 NOTE — ED Provider Notes (Signed)
[facility] EMERGENCY DEPARTMENT Provider Note   CSN: [id number] Arrival date & time: 09/04/22  1148     History  Chief Complaint  Patient presents with   Fall    Melissa Brennan is a 76 y.o. female.  The history is provided by the patient and a relative. No language interpreter was used.  Fall  76 year old female with a history of A-fib on apixaban presenting after a fall that occurred 3 days ago.  Patient reports that she slipped and fell down 2 steps and hurt her right mid back and left side of her head.  Denies dizziness, LOC.  She is not having any pain currently except some rib pain noted only with breathing or coughing.  She has not noticed any bruising.  She also reports sore throat, cough, right ear pain that started last night.  She has had some muffled hearing in her right ear.  She had inserted a Q-tip earlier and noted some white drainage.  She had some chills 2 days ago but denies any known fever.  Denies chest pain, shortness of breath, congestion, rhinorrhea.  No known sick contacts but she does work in a store.  She reports she typically has her ears cleaned out once a year.     Home Medications Prior to Admission medications   Medication Sig Start Date End Date Taking? Authorizing Provider  amiodarone (PACERONE) 100 MG tablet Take 100 mg by mouth daily. 03/10/21  Yes [provider]  apixaban (ELIQUIS) 5 MG TABS tablet Take 5 mg by mouth 2 (two) times daily.   Yes [provider]  omeprazole (PRILOSEC) 20 MG capsule Take 20 mg by mouth daily before breakfast.   Yes [provider]  rosuvastatin (CRESTOR) 10 MG tablet Take 10 mg by mouth daily. 12/19/20  Yes [provider]      Allergies    Medrol [methylprednisolone]    Review of Systems   Review of Systems  Physical Exam Updated Vital Signs BP 121/72 (BP Location: Right Arm)   Pulse 83   Temp 98.7 F (37.1 C) (Oral)   Resp 19   SpO2 99%  Physical  Exam Constitutional:      General: She is not in acute distress. HENT:     Head: Normocephalic and atraumatic.     Comments: Scalp appears normal, no lacerations or abrasions.    Right Ear: There is impacted cerumen.     Left Ear: There is impacted cerumen.     Ears:     Comments: Right ear is completely occluded by wet cerumen    Mouth/Throat:     Mouth: Mucous membranes are moist.     Pharynx: Oropharynx is clear. Posterior oropharyngeal erythema present. No oropharyngeal exudate.  Eyes:     General: No scleral icterus.       Right eye: No discharge.        Left eye: No discharge.     Extraocular Movements: Extraocular movements intact.     Pupils: Pupils are equal, round, and reactive to light.  Cardiovascular:     Rate and Rhythm: Normal rate and regular rhythm.     Heart sounds: Normal heart sounds. No murmur heard. Pulmonary:     Effort: Pulmonary effort is normal. No respiratory distress.     Breath sounds: Normal breath sounds.  Abdominal:     General: Bowel sounds are normal.     Palpations: Abdomen is soft.     Tenderness: There is  no abdominal tenderness.  Musculoskeletal:     Cervical back: Neck supple.     Right lower leg: No edema.     Left lower leg: No edema.     Comments: Right lower rib area where patient localizes pain without any tenderness to palpation.  No erythema, lacerations, abrasions or other skin changes.  Lymphadenopathy:     Cervical: No cervical adenopathy.  Skin:    General: Skin is warm and dry.  Neurological:     Mental Status: She is alert.     Cranial Nerves: Cranial nerves 2-12 are intact. No cranial nerve deficit or facial asymmetry.     Motor: Motor function is intact. No weakness.     Coordination: Coordination is intact. Finger-Nose-Finger Test normal.     Comments: 5/5 strength upper and lower extremities     ED Results / Procedures / Treatments   Labs (all labs ordered are listed, but only abnormal results are  displayed) Labs Reviewed  RESP PANEL BY RT-PCR (RSV, FLU A&B, COVID)  [id number]  GROUP A STREP BY PCR    EKG None  Radiology DG Ribs Unilateral W/Chest Right  Result Date: 09/04/2022 CLINICAL DATA:  Rib pain after a fall. EXAM: RIGHT RIBS AND CHEST - 3+ VIEW COMPARISON:  Chest x-ray 04/07/2021. FINDINGS: The lungs are clear without focal pneumonia, edema, pneumothorax or pleural effusion. Tiny right pleural effusion noted. The cardiopericardial silhouette is within normal limits for size. Bones are diffusely demineralized. Oblique views of the right ribs were obtained. Radio-opaque marker has been placed on the skin at the site of patient concern. No evidence for an acute displaced right-sided rib fracture. IMPRESSION: 1. Tiny right pleural effusion. Otherwise no acute cardiopulmonary findings. Consider follow-up to ensure resolution. 2. No evidence for an acute displaced right-sided rib fracture. Electronically Signed   By: Misty Stanley M.D.   On: 09/04/2022 12:30   CT Head Wo Contrast  Result Date: 09/04/2022 CLINICAL DATA:  Trauma EXAM: CT HEAD WITHOUT CONTRAST CT CERVICAL SPINE WITHOUT CONTRAST TECHNIQUE: Multidetector CT imaging of the head and cervical spine was performed following the standard protocol without intravenous contrast. Multiplanar CT image reconstructions of the cervical spine were also generated. RADIATION DOSE REDUCTION: This exam was performed according to the departmental dose-optimization program which includes automated exposure control, adjustment of the mA and/or kV according to patient size and/or use of iterative reconstruction technique. COMPARISON:  10/17/12 CT head. FINDINGS: CT HEAD FINDINGS Brain: No evidence of acute infarction, hemorrhage, hydrocephalus, extra-axial collection or mass lesion/mass effect. 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 Sequela of moderate chronic microvascular ischemic change. Vascular: No hyperdense vessel or  unexpected calcification. Skull: Normal. Negative for fracture or focal lesion. Sinuses/Orbits: No acute finding. Other: None. CT CERVICAL SPINE FINDINGS Alignment: Trace retrolisthesis of C3 on C4, C4 on C5, and C5 on C6. Skull base and vertebrae: No acute fracture. No primary bone lesion or focal pathologic process. Soft tissues and spinal canal: No prevertebral fluid or swelling. No visible canal hematoma. Disc levels: There is no evidence of high-grade spinal canal stenosis. Upper chest: Negative. Other: There is a large 3.2 x 2.7 cm left thyroid nodule (series 5, image 78). Asymmetrically prominent left level 3 lymph node measuring 7 mm (series 5, image 45). IMPRESSION: 1. No acute intracranial abnormality. 2. No acute cervical spine fracture. 3. Large 3.2 x 2.7 cm left thyroid nodule. Recommend further evaluation with a dedicated thyroid ultrasound, if not previously performed. Electronically Signed  By: Marin Roberts M.D.   On: 09/04/2022 12:25   CT Cervical Spine Wo Contrast  Result Date: 09/04/2022 CLINICAL DATA:  Trauma EXAM: CT HEAD WITHOUT CONTRAST CT CERVICAL SPINE WITHOUT CONTRAST TECHNIQUE: Multidetector CT imaging of the head and cervical spine was performed following the standard protocol without intravenous contrast. Multiplanar CT image reconstructions of the cervical spine were also generated. RADIATION DOSE REDUCTION: This exam was performed according to the departmental dose-optimization program which includes automated exposure control, adjustment of the mA and/or kV according to patient size and/or use of iterative reconstruction technique. COMPARISON:  10/17/12 CT head. FINDINGS: CT HEAD FINDINGS Brain: No evidence of acute infarction, hemorrhage, hydrocephalus, extra-axial collection or mass lesion/mass effect. 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 Sequela of moderate chronic microvascular ischemic change. Vascular: No hyperdense vessel or unexpected  calcification. Skull: Normal. Negative for fracture or focal lesion. Sinuses/Orbits: No acute finding. Other: None. CT CERVICAL SPINE FINDINGS Alignment: Trace retrolisthesis of C3 on C4, C4 on C5, and C5 on C6. Skull base and vertebrae: No acute fracture. No primary bone lesion or focal pathologic process. Soft tissues and spinal canal: No prevertebral fluid or swelling. No visible canal hematoma. Disc levels: There is no evidence of high-grade spinal canal stenosis. Upper chest: Negative. Other: There is a large 3.2 x 2.7 cm left thyroid nodule (series 5, image 78). Asymmetrically prominent left level 3 lymph node measuring 7 mm (series 5, image 45). IMPRESSION: 1. No acute intracranial abnormality. 2. No acute cervical spine fracture. 3. Large 3.2 x 2.7 cm left thyroid nodule. Recommend further evaluation with a dedicated thyroid ultrasound, if not previously performed. Electronically Signed   By: Marin Roberts M.D.   On: 09/04/2022 12:25    Procedures Procedures    Medications Ordered in ED Medications  acetaminophen (TYLENOL) tablet 1,000 mg (has no administration in time range)    ED Course/ Medical Decision Making/ A&P                           Medical Decision Making 76 year old female with a history of A-fib on apixaban presenting s/p mechanical fall 3 days ago with minimal injury.  She is overall well-appearing, minimal pain at present and with no skin changes noted.  Neurologically intact.  CT head and cervical spine as well as plain films of the ribs obtained in triage negative for intracranial bleeding and fracture.  Stable to be discharged from a fall perspective.  She also presents with a separate problem of sore throat and ear pain.  Suspect she likely has a viral illness.  She does have significant cerumen impaction worse on the right that is causing discomfort and hearing loss.  Will obtain respiratory panel, strep test, and will perform ear irrigation.  Negative for flu, COVID,  strep.  RSV.  After ear irrigation, there is significant less cerumen and patient symptomatically feels better and is able to hear better.  She likely has a viral URI, discussed supportive care.  Patient is stable for discharge at this time.   Final Clinical Impression(s) / ED Diagnoses Final diagnoses:  None    Rx / DC Orders ED Discharge Orders     None         Zola Button, MD 09/04/22 1443    Isla Pence, MD 09/04/22 [phone]

## 2022-10-04 ENCOUNTER — Ambulatory Visit (INDEPENDENT_AMBULATORY_CARE_PROVIDER_SITE_OTHER): Payer: Medicare Other | Admitting: Podiatry

## 2022-10-04 ENCOUNTER — Encounter: Payer: Self-pay | Admitting: Podiatry

## 2022-10-04 VITALS — BP 129/58 | HR 75

## 2022-10-04 DIAGNOSIS — M79676 Pain in unspecified toe(s): Secondary | ICD-10-CM | POA: Diagnosis not present

## 2022-10-04 DIAGNOSIS — G5792 Unspecified mononeuropathy of left lower limb: Secondary | ICD-10-CM

## 2022-10-04 DIAGNOSIS — B351 Tinea unguium: Secondary | ICD-10-CM

## 2022-10-04 DIAGNOSIS — M79672 Pain in left foot: Secondary | ICD-10-CM

## 2022-10-04 MED ORDER — DEXAMETHASONE SODIUM PHOSPHATE 120 MG/30ML IJ SOLN
2.0000 mg | Freq: Once | INTRAMUSCULAR | Status: AC
  Administered 2022-10-04: 2 mg via INTRA_ARTICULAR

## 2022-10-04 NOTE — Progress Notes (Signed)
She presents today for follow-up of her neuritis and that pinpoint area on the plantar medial aspect of the first metatarsophalangeal joint of her left foot.  States that the neurologist really did not know what to do stated that it was most likely a cutaneous neuroma on report however so there is nothing that he can do for.  He is also complaining of painful elongated toenails.  Objective: Vital signs stable alert and oriented x 3.  Pulses palpable.  Still has a painful area beneath the first metatarsal phalangeal joint.  Toenails are long thick yellow dystrophic pathology report does demonstrate nail dystrophy.  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  Plan: I injected that area today dexamethasone and local anesthetic 2 mg was injected after Betadine skin prep tolerated procedure well.  Debrided toenails 1 through 5 bilateral follow-up with her in 3 months

## 2022-10-19 ENCOUNTER — Other Ambulatory Visit: Payer: Self-pay | Admitting: Endocrinology

## 2022-10-19 DIAGNOSIS — E041 Nontoxic single thyroid nodule: Secondary | ICD-10-CM

## 2022-10-21 ENCOUNTER — Other Ambulatory Visit: Payer: Self-pay | Admitting: Obstetrics and Gynecology

## 2022-10-21 DIAGNOSIS — Z1231 Encounter for screening mammogram for malignant neoplasm of breast: Secondary | ICD-10-CM

## 2022-11-02 ENCOUNTER — Ambulatory Visit
Admission: RE | Admit: 2022-11-02 | Discharge: 2022-11-02 | Disposition: A | Discharge status: Home / Self Care | Payer: Medicare Other | Source: Ambulatory Visit | Attending: Endocrinology | Admitting: Endocrinology

## 2022-11-02 DIAGNOSIS — E041 Nontoxic single thyroid nodule: Secondary | ICD-10-CM

## 2022-11-07 ENCOUNTER — Other Ambulatory Visit: Payer: Self-pay | Admitting: Endocrinology

## 2022-11-07 DIAGNOSIS — E041 Nontoxic single thyroid nodule: Secondary | ICD-10-CM

## 2022-11-29 ENCOUNTER — Other Ambulatory Visit (HOSPITAL_COMMUNITY)
Admission: RE | Admit: 2022-11-29 | Discharge: 2022-11-29 | Disposition: A | Discharge status: Home / Self Care | Payer: Medicare Other | Source: Ambulatory Visit | Attending: Endocrinology | Admitting: Endocrinology

## 2022-11-29 ENCOUNTER — Ambulatory Visit
Admission: RE | Admit: 2022-11-29 | Discharge: 2022-11-29 | Disposition: A | Discharge status: Home / Self Care | Payer: Medicare Other | Source: Ambulatory Visit | Attending: Endocrinology | Admitting: Endocrinology

## 2022-11-29 DIAGNOSIS — E041 Nontoxic single thyroid nodule: Secondary | ICD-10-CM | POA: Insufficient documentation

## 2022-12-01 LAB — CYTOLOGY - NON PAP

## 2022-12-09 ENCOUNTER — Ambulatory Visit
Admission: RE | Admit: 2022-12-09 | Discharge: 2022-12-09 | Disposition: A | Discharge status: Home / Self Care | Payer: Medicare Other | Source: Ambulatory Visit | Attending: Obstetrics and Gynecology | Admitting: Obstetrics and Gynecology

## 2022-12-09 DIAGNOSIS — Z1231 Encounter for screening mammogram for malignant neoplasm of breast: Secondary | ICD-10-CM

## 2023-01-03 ENCOUNTER — Ambulatory Visit (INDEPENDENT_AMBULATORY_CARE_PROVIDER_SITE_OTHER): Payer: Medicare Other | Admitting: Podiatry

## 2023-01-03 ENCOUNTER — Encounter: Payer: Self-pay | Admitting: Podiatry

## 2023-01-03 DIAGNOSIS — D689 Coagulation defect, unspecified: Secondary | ICD-10-CM | POA: Diagnosis not present

## 2023-01-03 DIAGNOSIS — M79675 Pain in left toe(s): Secondary | ICD-10-CM | POA: Diagnosis not present

## 2023-01-03 DIAGNOSIS — B351 Tinea unguium: Secondary | ICD-10-CM

## 2023-01-03 DIAGNOSIS — M79676 Pain in unspecified toe(s): Secondary | ICD-10-CM

## 2023-01-03 NOTE — Progress Notes (Signed)
She presents today for follow-up of her elongated toenails and her painful hallux left.  Objective: Vitals are stable oriented x 3 toenails are long and dystrophic.  She does have pain on palpation to the first metatarsophalangeal joint and the hallux interphalangeal joint most likely due to the lack of range of motion.  Assessment: Nail dystrophy.  Osteoarthritis hallux interphalangeal joint.  Plan: Debrided nails follow-up on an as-needed basis.  Also Provided her with a 35-month handicap placard due to pain in her left foot.

## 2023-04-03 NOTE — Progress Notes (Unsigned)
Office Visit Note   Patient: Melissa Brennan           Date of Birth: Jan 19, 1946           MRN: [id number] Visit Date: 04/04/2023              Requested by: Rinaldo Cloud, MD [phone] [address] [place],  [place] [address] PCP: Rinaldo Cloud, MD   Assessment & Plan: Visit Diagnoses:  1. 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  Patient would like to repeat cortisone injection for now she is traveling to [place] for couple months and would like to delay hip replacement surgery until she comes back with her sister who can help her postoperatively.  Follow-Up Instructions: No follow-ups on file.   Orders:  Orders Placed This Encounter  Procedures   XR HIP UNILAT W OR W/O PELVIS 2-3 VIEWS RIGHT   No orders of the defined types were placed in this encounter.     Procedures: No procedures performed   Clinical Data: No additional findings.   Subjective: Chief Complaint  Patient presents with   Right Leg - Pain    HPI Patient is a 76 year old female with advanced right hip DJD.  I have been seeing her for couple years.  She has been having worse pain in the hip for the last few months now.  Denies any injuries.  She is unable to sleep on her right side.  She has had a cortisone injection in the past that has helped. Review of Systems  Constitutional: Negative.   HENT: Negative.    Eyes: Negative.   Respiratory: Negative.    Cardiovascular: Negative.   Endocrine: Negative.   Musculoskeletal: Negative.   Neurological: Negative.   Hematological: Negative.   Psychiatric/Behavioral: Negative.    All other systems reviewed and are negative.    Objective: Vital Signs: There were no vitals taken for this visit.  Physical Exam Vitals and nursing note reviewed.  Constitutional:      Appearance: She is well-developed.   HENT:     Head: Normocephalic and atraumatic.  Pulmonary:     Effort: Pulmonary effort is normal.  Abdominal:     Palpations: Abdomen is soft.  Musculoskeletal:     Cervical back: Neck supple.  Skin:    General: Skin is warm.     Capillary Refill: Capillary refill takes less than 2 seconds.  Neurological:     Mental Status: She is alert and oriented to person, place, and time.  Psychiatric:        Behavior: Behavior normal.        Thought Content: Thought content normal.        Judgment: Judgment normal.    Ortho Exam Examination of the right hip shows limited internal rotation.  Mild pain with hip flexion and external rotation.  Antalgic gait. Specialty Comments:  No specialty comments available.  Imaging: XR HIP UNILAT W OR W/O PELVIS 2-3 VIEWS RIGHT  Result Date: 04/04/2023 Advanced degenerative joint disease with bone-on-bone joint space narrowing.  Protrusio deformity    PMFS History: Patient Active Problem List   Diagnosis Date Noted   Constipation 08/04/2022   Primary osteoarthritis of right hip 06/23/2022   Chronic GI bleeding 06/21/2019   Past Medical History:  Diagnosis Date   A-fib [facility])    Hypertension  Vertigo     No family history on file.  Past Surgical History:  Procedure Laterality Date   BUNIONECTOMY     april 2023 was the 2nd surgery   ENTEROSCOPY N/A 02/12/2021   Procedure: ENTEROSCOPY;  Surgeon: Jeani Hawking, MD;  Location: [facility];  Service: Endoscopy;  Laterality: N/A;   ESOPHAGOGASTRODUODENOSCOPY (EGD) WITH PROPOFOL N/A 07/26/2019   Procedure: ESOPHAGOGASTRODUODENOSCOPY (EGD) WITH PROPOFOL;  Surgeon: Jeani Hawking, MD;  Location: [facility];  Service: Endoscopy;  Laterality: N/A;   HOT HEMOSTASIS N/A 07/26/2019   Procedure: HOT HEMOSTASIS (ARGON PLASMA COAGULATION/BICAP);  Surgeon: Jeani Hawking, MD;  Location: [facility];  Service: Endoscopy;  Laterality: N/A;   Social History   Occupational History   Not on file   Tobacco Use   Smoking status: Never   Smokeless tobacco: Never  Vaping Use   Vaping Use: Never used  Substance and Sexual Activity   Alcohol use: No   Drug use: No   Sexual activity: Not on file

## 2023-04-04 ENCOUNTER — Ambulatory Visit (INDEPENDENT_AMBULATORY_CARE_PROVIDER_SITE_OTHER): Payer: Medicare Other | Admitting: Orthopaedic Surgery

## 2023-04-04 ENCOUNTER — Other Ambulatory Visit (INDEPENDENT_AMBULATORY_CARE_PROVIDER_SITE_OTHER): Payer: Medicare Other

## 2023-04-04 DIAGNOSIS — M1611 Unilateral primary osteoarthritis, right hip: Secondary | ICD-10-CM | POA: Diagnosis not present

## 2023-04-17 ENCOUNTER — Encounter: Payer: Self-pay | Admitting: Sports Medicine

## 2023-04-17 ENCOUNTER — Ambulatory Visit (INDEPENDENT_AMBULATORY_CARE_PROVIDER_SITE_OTHER): Payer: Medicare Other | Admitting: Sports Medicine

## 2023-04-17 ENCOUNTER — Other Ambulatory Visit: Payer: Self-pay

## 2023-04-17 DIAGNOSIS — M25551 Pain in right hip: Secondary | ICD-10-CM

## 2023-04-17 DIAGNOSIS — M1611 Unilateral primary osteoarthritis, right hip: Secondary | ICD-10-CM

## 2023-04-17 MED ORDER — LIDOCAINE HCL 1 % IJ SOLN
4.0000 mL | INTRAMUSCULAR | Status: AC | PRN
  Administered 2023-04-17: 4 mL

## 2023-04-17 MED ORDER — METHYLPREDNISOLONE ACETATE 40 MG/ML IJ SUSP
80.0000 mg | INTRAMUSCULAR | Status: AC | PRN
  Administered 2023-04-17: 80 mg via INTRA_ARTICULAR

## 2023-04-17 NOTE — Progress Notes (Signed)
   Procedure Note  Patient: Melissa Brennan             Date of Birth: 1946/08/22           MRN: [id number]             Visit Date: 04/17/2023  Procedures: Visit Diagnoses:  1. Primary osteoarthritis of right hip   2. Pain in right hip    Large Joint Inj: R hip joint on 04/17/2023 9:56 AM Indications: pain Details: 22 G 3.5 in needle, ultrasound-guided anterior approach Medications: 4 mL lidocaine 1 %; 80 mg methylPREDNISolone acetate 40 MG/ML Outcome: tolerated well, no immediate complications  Procedure: US-guided intra-articular hip injection, right After discussion on risks/benefits/indications and informed verbal consent was obtained, a timeout was performed. Patient was lying supine on exam table. The hip was cleaned with betadine and alcohol swabs. Then utilizing ultrasound guidance, the patient's femoral head and neck junction was identified and subsequently injected with 4:2 lidocaine:depomedrol via an in-plane approach with ultrasound visualization of the injectate administered into the hip joint. Patient tolerated procedure well without immediate complications.  Procedure, treatment alternatives, risks and benefits explained, specific risks discussed. Consent was given by the patient. Immediately prior to procedure a time out was called to verify the correct patient, procedure, equipment, support staff and site/side marked as required. Patient was prepped and draped in the usual sterile fashion.    - I evaluated the patient about 5 minutes post-injection and she had improvement in pain and range of motion - follow-up with Dr. Roda Shutters as indicated; I am happy to see them as needed  Madelyn Brunner, DO Primary Care Sports Medicine Physician  [facility] - Orthopedics  This note was dictated using Dragon naturally speaking software and may contain errors in syntax, spelling, or content which have not been identified prior to signing this note.

## 2023-09-04 NOTE — Progress Notes (Unsigned)
Office Visit Note   Patient: Melissa Brennan           Date of Birth: [birth year]           MRN: [id number] Visit Date: 09/05/2023              Requested by: Rinaldo Cloud, MD [phone] [address] [place],  [place] [address] PCP: Rinaldo Cloud, MD   Assessment & Plan: Visit Diagnoses:  1. 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  I stressed to her that she should seriously consider hip replacement before the deformity gets much worse.  She is worried about the postop care that she would need from family which she currently does not have.  Will send her to Dr. Shon Baton for another injection today but she understands that she needs to seriously consider doing surgery.  This will be her third injection.  Follow-Up Instructions: No follow-ups on file.   Orders:  No orders of the defined types were placed in this encounter.  No orders of the defined types were placed in this encounter.     Procedures: No procedures performed   Clinical Data: No additional findings.   Subjective: Chief Complaint  Patient presents with   Right Hip - Pain    HPI Patient returns today for follow-up evaluation of right hip pain.  She has known end-stage right hip DJD with protrusio deformity.  Her last injection was on 04/17/2023.  She started to have pain again last week.  She has had 2 injections so far. Review of Systems  Constitutional: Negative.   HENT: Negative.    Eyes: Negative.   Respiratory: Negative.    Cardiovascular: Negative.   Endocrine: Negative.   Musculoskeletal:  Positive for arthralgias.  Neurological: Negative.   Hematological: Negative.   Psychiatric/Behavioral: Negative.    All other systems reviewed and are negative.    Objective: Vital Signs: There were no vitals taken for this visit.  Physical Exam Vitals and nursing note reviewed.  Constitutional:       Appearance: She is well-developed.  HENT:     Head: Normocephalic and atraumatic.  Pulmonary:     Effort: Pulmonary effort is normal.  Abdominal:     Palpations: Abdomen is soft.  Musculoskeletal:     Cervical back: Neck supple.  Skin:    General: Skin is warm.     Capillary Refill: Capillary refill takes less than 2 seconds.  Neurological:     Mental Status: She is alert and oriented to person, place, and time.  Psychiatric:        Behavior: Behavior normal.        Thought Content: Thought content normal.        Judgment: Judgment normal.     Ortho Exam Exam of the right hip is unchanged. Specialty Comments:  No specialty comments available.  Imaging: No results found.   PMFS History: Patient Active Problem List   Diagnosis Date Noted   Constipation 08/04/2022   Primary osteoarthritis of right hip 06/23/2022   Chronic GI bleeding 06/21/2019   Past Medical History:  Diagnosis Date   A-fib [facility])    Hypertension    Vertigo     No family history on file.  Past Surgical History:  Procedure Laterality Date   BUNIONECTOMY     april 2023 was the 2nd surgery   ENTEROSCOPY N/A 02/12/2021   Procedure:  ENTEROSCOPY;  Surgeon: Jeani Hawking, MD;  Location: [facility];  Service: Endoscopy;  Laterality: N/A;   ESOPHAGOGASTRODUODENOSCOPY (EGD) WITH PROPOFOL N/A 07/26/2019   Procedure: ESOPHAGOGASTRODUODENOSCOPY (EGD) WITH PROPOFOL;  Surgeon: Jeani Hawking, MD;  Location: [facility];  Service: Endoscopy;  Laterality: N/A;   HOT HEMOSTASIS N/A 07/26/2019   Procedure: HOT HEMOSTASIS (ARGON PLASMA COAGULATION/BICAP);  Surgeon: Jeani Hawking, MD;  Location: [facility];  Service: Endoscopy;  Laterality: N/A;   Social History   Occupational History   Not on file  Tobacco Use   Smoking status: Never   Smokeless tobacco: Never  Vaping Use   Vaping status: Never Used  Substance and Sexual Activity   Alcohol use: No   Drug use: No   Sexual activity: Not on file

## 2023-09-05 ENCOUNTER — Encounter: Payer: Self-pay | Admitting: Orthopaedic Surgery

## 2023-09-05 ENCOUNTER — Encounter: Payer: Self-pay | Admitting: Sports Medicine

## 2023-09-05 ENCOUNTER — Ambulatory Visit (INDEPENDENT_AMBULATORY_CARE_PROVIDER_SITE_OTHER): Payer: Medicare Other | Admitting: Orthopaedic Surgery

## 2023-09-05 ENCOUNTER — Ambulatory Visit (INDEPENDENT_AMBULATORY_CARE_PROVIDER_SITE_OTHER): Payer: Medicare Other | Admitting: Sports Medicine

## 2023-09-05 ENCOUNTER — Other Ambulatory Visit: Payer: Self-pay

## 2023-09-05 DIAGNOSIS — M1611 Unilateral primary osteoarthritis, right hip: Secondary | ICD-10-CM | POA: Diagnosis not present

## 2023-09-05 DIAGNOSIS — M25551 Pain in right hip: Secondary | ICD-10-CM

## 2023-09-05 MED ORDER — LIDOCAINE HCL 1 % IJ SOLN
4.0000 mL | INTRAMUSCULAR | Status: AC | PRN
  Administered 2023-09-05: 4 mL

## 2023-09-05 MED ORDER — METHYLPREDNISOLONE ACETATE 40 MG/ML IJ SUSP
80.0000 mg | INTRAMUSCULAR | Status: AC | PRN
  Administered 2023-09-05: 80 mg via INTRA_ARTICULAR

## 2023-09-05 NOTE — Progress Notes (Signed)
   Procedure Note  Patient: Melissa Brennan             Date of Birth: Jul 19, 1946           MRN: [id number]             Visit Date: 09/05/2023  Procedures: Visit Diagnoses:  1. Pain in right hip   2. Primary osteoarthritis of right hip    Large Joint Inj: R hip joint on 09/05/2023 8:43 AM Indications: pain Details: 22 G 3.5 in needle, ultrasound-guided anterior approach Medications: 4 mL lidocaine 1 %; 80 mg methylPREDNISolone acetate 40 MG/ML Outcome: tolerated well, no immediate complications  Procedure: US-guided intra-articular hip injection, right After discussion on risks/benefits/indications and informed verbal consent was obtained, a timeout was performed. Patient was lying supine on exam table. The hip was cleaned with betadine and alcohol swabs. Then utilizing ultrasound guidance, the patient's femoral head and neck junction was identified and subsequently injected with 4:2 lidocaine:depomedrol via an in-plane approach with ultrasound visualization of the injectate administered into the hip joint. Patient tolerated procedure well without immediate complications.  Procedure, treatment alternatives, risks and benefits explained, specific risks discussed. Consent was given by the patient. Immediately prior to procedure a time out was called to verify the correct patient, procedure, equipment, support staff and site/side marked as required. Patient was prepped and draped in the usual sterile fashion.     - follow-up with Dr. Roda Shutters as indicated; I am happy to see them as needed  Madelyn Brunner, DO Primary Care Sports Medicine Physician  [facility] - Orthopedics  This note was dictated using Dragon naturally speaking software and may contain errors in syntax, spelling, or content which have not been identified prior to signing this note.

## 2023-09-07 ENCOUNTER — Telehealth: Payer: Self-pay | Admitting: Orthopaedic Surgery

## 2023-09-07 NOTE — Telephone Encounter (Signed)
Left handicap application up front. Notified patient.

## 2023-09-07 NOTE — Telephone Encounter (Signed)
Patient called asked if she can get a handicap placard. Patient said she will pick up the placard when it is ready. The number to contact patient is [phone]

## 2023-09-07 NOTE — Telephone Encounter (Signed)
Permanent thanks

## 2023-09-27 ENCOUNTER — Other Ambulatory Visit: Payer: Self-pay

## 2023-09-27 ENCOUNTER — Encounter (HOSPITAL_COMMUNITY): Payer: Self-pay | Admitting: Emergency Medicine

## 2023-09-27 ENCOUNTER — Emergency Department (HOSPITAL_COMMUNITY): Payer: Medicare Other

## 2023-09-27 ENCOUNTER — Emergency Department (HOSPITAL_COMMUNITY)
Admission: EM | Admit: 2023-09-27 | Discharge: 2023-09-27 | Disposition: A | Discharge status: Home / Self Care | Payer: Medicare Other | Attending: Emergency Medicine | Admitting: Emergency Medicine

## 2023-09-27 DIAGNOSIS — M25551 Pain in right hip: Secondary | ICD-10-CM | POA: Diagnosis present

## 2023-09-27 DIAGNOSIS — Z7901 Long term (current) use of anticoagulants: Secondary | ICD-10-CM | POA: Insufficient documentation

## 2023-09-27 MED ORDER — KETOROLAC TROMETHAMINE 15 MG/ML IJ SOLN
15.0000 mg | Freq: Once | INTRAMUSCULAR | Status: AC
  Administered 2023-09-27: 15 mg via INTRAMUSCULAR
  Filled 2023-09-27: qty 1

## 2023-09-27 MED ORDER — ACETAMINOPHEN 500 MG PO TABS
1000.0000 mg | ORAL_TABLET | Freq: Once | ORAL | Status: AC
  Administered 2023-09-27: 1000 mg via ORAL
  Filled 2023-09-27: qty 2

## 2023-09-27 MED ORDER — METHOCARBAMOL 500 MG PO TABS: 500.0000 mg | ORAL_TABLET | Freq: Two times a day (BID) | ORAL | 0 refills | Status: AC

## 2023-09-27 MED ORDER — OXYCODONE HCL 5 MG PO TABS
5.0000 mg | ORAL_TABLET | Freq: Once | ORAL | Status: AC
  Administered 2023-09-27: 5 mg via ORAL
  Filled 2023-09-27: qty 1

## 2023-09-27 MED ORDER — DICLOFENAC SODIUM 1 % EX GEL: 4.0000 g | Freq: Four times a day (QID) | CUTANEOUS | 0 refills | Status: AC

## 2023-09-27 NOTE — ED Notes (Signed)
 Patient reports that she received a lidocaine shot in her hip yesterday that did not help.

## 2023-09-27 NOTE — ED Provider Notes (Signed)
 [place] EMERGENCY DEPARTMENT AT [facility] Provider Note   CSN: [id number] Arrival date & time: 09/27/23  1005     History  Chief Complaint  Patient presents with   Hip Pain    Melissa Brennan is a 78 y.o. female.  78 yo F with a chief complaint of right hip pain.  This unfortunately she has been struggling with for some time.  She been seeing orthopedics and they are planning to perhaps do a replacements.  She has been getting injections.  Last injection was on December 10.  She did not feel like this injection helped that much.  Has had progressive worsening pain.  She feels like it is worse when she tries to stand and ambulate.  She denies trauma denies loss of bowel or bladder denies loss of perirectal sensation denies numbness or weakness to the leg.   Hip Pain       Home Medications Prior to Admission medications   Medication Sig Start Date End Date Taking? Authorizing Provider  diclofenac  Sodium (VOLTAREN ) 1 % GEL Apply 4 g topically 4 (four) times daily. 09/27/23  Yes Emil Share, DO  methocarbamol  (ROBAXIN ) 500 MG tablet Take 1 tablet (500 mg total) by mouth 2 (two) times daily. 09/27/23  Yes Emil Share, DO  amiodarone  (PACERONE ) 100 MG tablet Take 100 mg by mouth daily. 03/10/21   [provider]  apixaban  (ELIQUIS ) 5 MG TABS tablet Take 5 mg by mouth 2 (two) times daily.    [provider]  omeprazole (PRILOSEC) 20 MG capsule Take 20 mg by mouth daily before breakfast.    [provider]  rosuvastatin  (CRESTOR ) 10 MG tablet Take 10 mg by mouth daily. 12/19/20   [provider]      Allergies    Medrol  [methylprednisolone ]    Review of Systems   Review of Systems  Physical Exam Updated Vital Signs BP 130/70   Pulse 71   Temp 97.7 F (36.5 C)   Resp 16   Ht 5' 4 (1.626 m)   Wt 45.4 kg   SpO2 94%   BMI 17.16 kg/m  Physical Exam Vitals and nursing note reviewed.  Constitutional:      General: She is not in acute  distress.    Appearance: She is well-developed. She is not diaphoretic.  HENT:     Head: Normocephalic and atraumatic.  Eyes:     Pupils: Pupils are equal, round, and reactive to light.  Cardiovascular:     Rate and Rhythm: Normal rate and regular rhythm.     Heart sounds: No murmur heard.    No friction rub. No gallop.  Pulmonary:     Effort: Pulmonary effort is normal.     Breath sounds: No wheezing or rales.  Abdominal:     General: There is no distension.     Palpations: Abdomen is soft.     Tenderness: There is no abdominal tenderness.  Musculoskeletal:        General: No tenderness.     Cervical back: Normal range of motion and neck supple.     Comments: Pulse motor and sensation intact to the right lower extremity.  Reflexes are 2+ and equal.  There is no clonus.  Negative straight leg raise test.  She does not have significant discomfort with internal and external rotation of the right lower extremity.  Skin:    General: Skin is warm and dry.  Neurological:     Mental Status: She  is alert and oriented to person, place, and time.  Psychiatric:        Behavior: Behavior normal.     ED Results / Procedures / Treatments   Labs (all labs ordered are listed, but only abnormal results are displayed) Labs Reviewed - No data to display  EKG None  Radiology CT Renal Stone Study Result Date: 09/27/2023 CLINICAL DATA:  Right abdominal pain radiating to patient's lower back, right hip, and lower extremity. EXAM: CT ABDOMEN AND PELVIS WITHOUT CONTRAST TECHNIQUE: Multidetector CT imaging of the abdomen and pelvis was performed following the standard protocol without IV contrast. RADIATION DOSE REDUCTION: This exam was performed according to the departmental dose-optimization program which includes automated exposure control, adjustment of the mA and/or kV according to patient size and/or use of iterative reconstruction technique. COMPARISON:  None available FINDINGS: Lower chest: No  acute abnormality. Hepatobiliary: No focal liver abnormality is seen. No gallstones, gallbladder wall thickening, or biliary dilatation. Pancreas: Unremarkable. No pancreatic ductal dilatation or surrounding inflammatory changes. Spleen: Normal in size without focal abnormality. Adrenals/Urinary Tract: Adrenal glands are unremarkable. Kidneys are normal, without renal calculi, focal lesion, or hydronephrosis. Bladder is unremarkable. Stomach/Bowel: No bowel dilatation to indicate ileus or obstruction. Appendix is normal. Few scattered ascending colon diverticula are present without evidence of acute diverticulitis. Vascular/Lymphatic: No significant vascular findings are present. No enlarged abdominal or pelvic lymph nodes. Reproductive: 2.4 cm fluid density structure noted in the left ovary. No significant abnormality of the uterus or right adnexa. Other: No abdominal wall hernia or abnormality. No abdominopelvic ascites. Musculoskeletal: Severe degenerative changes of the right hip. Degenerative changes seen throughout the lumbar spine. No acute osseous abnormality. IMPRESSION: 1. No acute abnormality of the abdomen or pelvis. Normal appendix. No renal or ureteral calculi. No hydronephrosis or hydroureter. 2. Severe degenerative changes of the right hip. 3. 2.4 cm fluid density structure in the left ovary, likely a cyst. No follow-up imaging recommended. Note: This recommendation does not apply to premenarchal patients and to those with increased risk (genetic, family history, elevated tumor markers or other high-risk factors) of ovarian cancer. Reference: JACR 2020 Feb; 17(2):248-254 Electronically Signed   By: Aliene Lloyd M.D.   On: 09/27/2023 11:43   CT L-SPINE NO CHARGE Result Date: 09/27/2023 CLINICAL DATA:  Right-sided abdominal pain radiating into the lower back, right hip and right leg. EXAM: CT LUMBAR SPINE WITHOUT CONTRAST TECHNIQUE: Multidetector CT imaging of the lumbar spine was performed without  intravenous contrast administration. Multiplanar CT image reconstructions were also generated. RADIATION DOSE REDUCTION: This exam was performed according to the departmental dose-optimization program which includes automated exposure control, adjustment of the mA and/or kV according to patient size and/or use of iterative reconstruction technique. COMPARISON:  Lumbar spine radiographs 07/30/2018. FINDINGS: Segmentation: Conventional numbering is assumed with 5 non-rib-bearing, lumbar type vertebral bodies. Alignment: Unchanged 3 mm stair step retrolisthesis of L1 on L2 and L2 on L3. Vertebrae: Normal vertebral body heights. No acute fracture. Multilevel degenerative endplate changes, greatest at L2-3 and L4-5. Paraspinal and other soft tissues: Mild fatty atrophy of the multifidus muscles. Please refer to same-day abdominal CT report for retroperitoneal findings. Disc levels: T12-L1:  Severe disc height loss. L1-L2: Severe disc height loss and retrolisthesis with superimposed disc bulge resulting in mild spinal canal stenosis. Facet arthropathy contributes to moderate bilateral neural foraminal narrowing. L2-L3: Severe disc height loss, retrolisthesis and superimposed disc bulge results in mild spinal canal stenosis and moderate bilateral neural foraminal narrowing. L3-L4: Disc bulge  results in mild spinal canal stenosis. Mild bilateral facet arthropathy. L4-L5: Severe disc height loss with superimposed disc bulge and endplate osteophytes results in moderate spinal canal stenosis with likely mass effect on the traversing left L5 nerve root in the subarticular zone and moderate bilateral neural foraminal narrowing. L5-S1: Disc bulge and facet arthropathy contribute to moderate bilateral neural foraminal narrowing. IMPRESSION: 1. Multilevel degenerative changes of the lumbar spine, greatest at L4-5 where there is moderate spinal canal stenosis with likely mass effect on the traversing left L5 nerve root in the  subarticular zone and moderate bilateral neural foraminal narrowing. 2. Moderate bilateral neural foraminal narrowing at L1-2, L2-3, and L5-S1. Electronically Signed   By: Ryan Chess M.D.   On: 09/27/2023 11:20    Procedures Procedures    Medications Ordered in ED Medications  acetaminophen  (TYLENOL ) tablet 1,000 mg (1,000 mg Oral Given 09/27/23 1223)  oxyCODONE  (Oxy IR/ROXICODONE ) immediate release tablet 5 mg (5 mg Oral Given 09/27/23 1223)  ketorolac  (TORADOL ) 15 MG/ML injection 15 mg (15 mg Intramuscular Given 09/27/23 1232)    ED Course/ Medical Decision Making/ A&P                                 Medical Decision Making Amount and/or Complexity of Data Reviewed Radiology: ordered.  Risk OTC drugs. Prescription drug management.   78 yo F with a chief complaint of right hip pain.  This has been an ongoing issue for her.  She sees orthopedics in the office for this.  Most recently had a hip injection on the 10th of last month.  She tells me that her pain is not controlled today and it is quite severe.  Will obtain CT imaging to assess for occult fracture versus intra-abdominal or intraspinal pathology.  CT consistent with severe right hip arthropathy.  There is some degenerative disease of the spine but seems to be more likely to be left-sided which does not fit the clinical picture.  No other obvious intra-abdominal or pelvic pathology that could be causing her discomfort.  Will have the patient follow-up with her orthopedist in the office.  12:51 PM:  I have discussed the diagnosis/risks/treatment options with the patient.  Evaluation and diagnostic testing in the emergency department does not suggest an emergent condition requiring admission or immediate intervention beyond what has been performed at this time.  They will follow up with Ortho. We also discussed returning to the ED immediately if new or worsening sx occur. We discussed the sx which are most concerning (e.g.,  sudden worsening pain, fever, inability to tolerate by mouth) that necessitate immediate return. Medications administered to the patient during their visit and any new prescriptions provided to the patient are listed below.  Medications given during this visit Medications  acetaminophen  (TYLENOL ) tablet 1,000 mg (1,000 mg Oral Given 09/27/23 1223)  oxyCODONE  (Oxy IR/ROXICODONE ) immediate release tablet 5 mg (5 mg Oral Given 09/27/23 1223)  ketorolac  (TORADOL ) 15 MG/ML injection 15 mg (15 mg Intramuscular Given 09/27/23 1232)     The patient appears reasonably screen and/or stabilized for discharge and I doubt any other medical condition or other [facility] requiring further screening, evaluation, or treatment in the ED at this time prior to discharge.          Final Clinical Impression(s) / ED Diagnoses Final diagnoses:  Right hip pain    Rx / DC Orders ED Discharge Orders  Ordered    diclofenac  Sodium (VOLTAREN ) 1 % GEL  4 times daily        09/27/23 1243    methocarbamol  (ROBAXIN ) 500 MG tablet  2 times daily        09/27/23 1243              Emil Share, DO 09/27/23 1251

## 2023-09-27 NOTE — ED Triage Notes (Signed)
 Pt arrives via EMS from home with right hip pain, chronic in nature. Denies recent injury. Pt c/o increased pain the last few days. 10/10 pain. VSS. Pt ambulatory.

## 2023-09-27 NOTE — Discharge Instructions (Signed)
   Use the gel as prescribed.   Also take tylenol 1000mg (2 extra strength) four times a day.   You can try the muscle relaxant and see if it helps. Follow up with your ortho doc

## 2023-10-03 ENCOUNTER — Ambulatory Visit: Payer: Medicare Other | Admitting: Physician Assistant

## 2023-10-03 ENCOUNTER — Other Ambulatory Visit (INDEPENDENT_AMBULATORY_CARE_PROVIDER_SITE_OTHER): Payer: Medicare Other

## 2023-10-03 ENCOUNTER — Encounter: Payer: Self-pay | Admitting: Physician Assistant

## 2023-10-03 DIAGNOSIS — M1611 Unilateral primary osteoarthritis, right hip: Secondary | ICD-10-CM | POA: Diagnosis not present

## 2023-10-03 NOTE — Progress Notes (Signed)
 Office Visit Note   Patient: Melissa Brennan           Date of Birth: 1946/03/24           MRN: [id number] Visit Date: 10/03/2023              Requested by: Levern Hutching, MD [phone] [address] [place],  [place] [address] PCP: Levern Hutching, MD   Assessment & Plan: Visit Diagnoses:  1. 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  Imaging shows bone on bone joint space narrowing.  At this point, conservative treatments fail to provide any significant relief and the pain is severely affecting ADLs and quality of life.  Based on treatment options, the patient has elected to move forward with a hip replacement.  We have discussed the surgical risks that include but are not limited to infection, DVT, leg length discrepancy, numbness, tingling, incomplete relief of pain.  Recovery and prognosis were also reviewed.  W will call the patient to schedule surgery.  She will need to have this no sooner than 12/04/2023 as she underwent intra-articular cortisone injection on 09/05/2023.  Current anticoagulants: Eliquis  (apixaban ) daily Postop anticoagulation: Eliquis  Diabetic: No  Prior DVT/PE: No Tobacco use: No Clearances needed for surgery: pcp and cardiology Burnetta Levern, MD) Anticipate discharge dispo: home   Follow-Up Instructions: Return for post-op.   Orders:  Orders Placed This Encounter  Procedures   XR HIP UNILAT W OR W/O PELVIS 2-3 VIEWS RIGHT   No orders of the defined types were placed in this encounter.     Procedures: No procedures performed   Clinical Data: No additional findings.   Subjective: Chief Complaint  Patient presents with   Right Hip - Follow-up    HPI patient is a very pleasant 78 year old female who comes in today with chronic right hip pain.  History of severe osteoarthritis with protrusio deformity.  She has been  trying to treat this with injections.  Her last cortisone injection was 09/05/2023 which provided temporary relief.  She continues to have pain primarily with activity.  She denies any pain with rest.  She is not taking anything for this.  Review of Systems as detailed in HPI.  All others reviewed and are negative.   Objective: Vital Signs: There were no vitals taken for this visit.  Physical Exam well-developed well-nourished female no acute distress.  Alert and oriented x 3.  Ortho Exam unchanged right hip exam  Specialty Comments:  No specialty comments available.  Imaging: XR HIP UNILAT W OR W/O PELVIS 2-3 VIEWS RIGHT Result Date: 10/03/2023 Significant degenerative joint disease with protrusio deformity    PMFS History: Patient Active Problem List   Diagnosis Date Noted   Constipation 08/04/2022   Primary osteoarthritis of right hip 06/23/2022   Chronic GI bleeding 06/21/2019   Past Medical History:  Diagnosis Date   A-fib [facility])    Hypertension    Vertigo     No family history on file.  Past Surgical History:  Procedure Laterality Date   BUNIONECTOMY     april 2023 was the 2nd surgery   ENTEROSCOPY N/A 02/12/2021   Procedure: ENTEROSCOPY;  Surgeon: Rollin Dover, MD;  Location: [facility];  Service: Endoscopy;  Laterality: N/A;   ESOPHAGOGASTRODUODENOSCOPY (EGD) WITH PROPOFOL  N/A 07/26/2019   Procedure: ESOPHAGOGASTRODUODENOSCOPY (EGD) WITH PROPOFOL ;  Surgeon: Rollin Dover, MD;  Location: [facility];  Service: Endoscopy;  Laterality: N/A;   HOT HEMOSTASIS N/A 07/26/2019   Procedure: HOT HEMOSTASIS (ARGON PLASMA COAGULATION/BICAP);  Surgeon: Rollin Dover, MD;  Location: [facility];  Service: Endoscopy;  Laterality: N/A;   Social History   Occupational History   Not on file  Tobacco Use   Smoking status: Never   Smokeless tobacco: Never  Vaping Use   Vaping status: Never Used  Substance and Sexual Activity   Alcohol use: No   Drug use: No   Sexual  activity: Not on file

## 2023-10-06 ENCOUNTER — Ambulatory Visit: Payer: Medicare Other | Admitting: Orthopaedic Surgery

## 2023-11-08 ENCOUNTER — Telehealth: Payer: Self-pay | Admitting: Orthopaedic Surgery

## 2023-11-08 ENCOUNTER — Other Ambulatory Visit: Payer: Self-pay | Admitting: Obstetrics and Gynecology

## 2023-11-08 DIAGNOSIS — Z1231 Encounter for screening mammogram for malignant neoplasm of breast: Secondary | ICD-10-CM

## 2023-11-08 NOTE — Telephone Encounter (Signed)
Patient called today to cancel her RIGHT THA with Dr. Roda Shutters for 12-11-23.   She states she has nobody to stay with her after the surgery.  She said she will call back in the future if she decides to have surgery.

## 2023-11-08 NOTE — Telephone Encounter (Signed)
Thanks for update

## 2023-12-11 ENCOUNTER — Ambulatory Visit (HOSPITAL_COMMUNITY): Admit: 2023-12-11 | Payer: Medicare Other | Admitting: Orthopaedic Surgery

## 2023-12-11 DIAGNOSIS — M1611 Unilateral primary osteoarthritis, right hip: Secondary | ICD-10-CM

## 2023-12-11 SURGERY — TOTAL HIP ARTHROPLASTY ANTERIOR APPROACH
Anesthesia: Spinal | Site: Hip | Laterality: Right

## 2023-12-12 ENCOUNTER — Ambulatory Visit
Admission: RE | Admit: 2023-12-12 | Discharge: 2023-12-12 | Disposition: A | Discharge status: Home / Self Care | Payer: Medicare Other | Source: Ambulatory Visit | Attending: Obstetrics and Gynecology | Admitting: Obstetrics and Gynecology

## 2023-12-12 DIAGNOSIS — Z1231 Encounter for screening mammogram for malignant neoplasm of breast: Secondary | ICD-10-CM

## 2023-12-26 ENCOUNTER — Encounter: Payer: Medicare Other | Admitting: Physician Assistant

## 2024-01-29 ENCOUNTER — Ambulatory Visit: Admitting: Podiatry

## 2024-02-08 ENCOUNTER — Ambulatory Visit (INDEPENDENT_AMBULATORY_CARE_PROVIDER_SITE_OTHER): Admitting: Podiatry

## 2024-02-08 ENCOUNTER — Ambulatory Visit (INDEPENDENT_AMBULATORY_CARE_PROVIDER_SITE_OTHER)

## 2024-02-08 DIAGNOSIS — M79676 Pain in unspecified toe(s): Secondary | ICD-10-CM | POA: Diagnosis not present

## 2024-02-08 DIAGNOSIS — M205X2 Other deformities of toe(s) (acquired), left foot: Secondary | ICD-10-CM

## 2024-02-08 DIAGNOSIS — B351 Tinea unguium: Secondary | ICD-10-CM

## 2024-02-08 NOTE — Progress Notes (Signed)
 She presents today for her routine nail debridement.  She is also complaining of pain to the first metatarsal phalangeal joint of the left foot.  Primarily on the bottom and also in the interphalangeal joint.  Objective: Vital signs are stable alert oriented x 3.  Pulses are palpable.  Toenails are long thick yellow dystrophic sharply incurvated and tender on palpation.  She has good range of motion first metatarsophalangeal joint of the left foot however she has osteoarthritic changes and tenderness on range of motion of the interphalangeal joint in particular on palpation of the sesamoids.  Radiographs taken today demonstrate osseously mature foot joint with generalized demineralization of the bone.  Intact first metatarsal phalangeal joint replacement appears to be silicone.  Sesamoids do appear to be cystic with spurring.  Some spurring is also noted around the interphalangeal joint.  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  Pain limb secondary to onychomycosis and nail dystrophy.  Plan: Debridement of toenails 1 through 5 bilaterally also recommended that she utilize the Voltaren  gel around the first metatarsal phalangeal joint and interphalangeal joint to help reduce her symptomatology.  We also discussed appropriate inserts.

## 2024-04-26 ENCOUNTER — Telehealth: Payer: Self-pay

## 2024-04-26 NOTE — Telephone Encounter (Signed)
 Pt called stating she has fallen and broken her hip. She is currently in a [place]  hospital. She is going to have her x-rays burned on a disc and have them shipped here. Myself and Deane tried to explain to her that since she is in [place]  there is not much Dr. Jerri can do. She still wants to hear from him on what she should do. The hospital she is at is wanting her to do physical therapy but she states she is unable to move at all. Her call back number is [phone].

## 2024-04-29 NOTE — Telephone Encounter (Signed)
Called patient tonight.

## 2024-05-17 ENCOUNTER — Ambulatory Visit (INDEPENDENT_AMBULATORY_CARE_PROVIDER_SITE_OTHER): Admitting: Orthopaedic Surgery

## 2024-05-17 ENCOUNTER — Other Ambulatory Visit (INDEPENDENT_AMBULATORY_CARE_PROVIDER_SITE_OTHER): Payer: Self-pay

## 2024-05-17 DIAGNOSIS — M25551 Pain in right hip: Secondary | ICD-10-CM

## 2024-05-17 NOTE — Progress Notes (Signed)
 Office Visit Note   Patient: Melissa Brennan           Date of Birth: 03-Jan-1946           MRN: [id number] Visit Date: 05/17/2024              Requested by: Levern Hutching, MD [phone] [address] [place],  [place] [address] PCP: Levern Hutching, MD   Assessment & Plan: Visit Diagnoses:  1. Pain in right hip     Plan: History of Present Illness Melissa Brennan is a 78 year old female who presents for follow-up after sustaining pelvic fractures from a fall. She is accompanied by her niece.    Three weeks ago, she fell while getting out of a car in [place], resulting in pelvic fractures. She was hospitalized for three days and underwent ten days of therapy, which she feels has improved her condition slightly.  She is currently taking Robaxin  and tramadol for pain management, though Robaxin  causes nausea. She is not taking much pain medication at this time.  Due to discomfort and lack of confidence, she is unable to drive and uses a walker for ambulation, which she needs for balance and security. She has arthritis and has been informed in the past that she needs a hip replacement, but this is not currently feasible due to her fractures.  She stopped formal physical therapy a week ago but continues to perform exercises at home. She has concerns about her balance and is considering resuming therapy if needed.  She works part-time using a [employer], which involves sitting. She is driven to work by her niece and feels capable of performing her job duties, which do not require significant physical exertion. She is cautious about lifting weights, currently limiting herself to what she feels comfortable with, which is significantly less than her previous capacity.  No new symptoms other than those related to her fall and subsequent fractures.  Exam shows no neurovascular compromise to bilateral lower extremities.  Results RADIOLOGY Pelvic X-ray: Pelvic fractures present  (04/25/2024)  Assessment and Plan Pelvic fracture Pelvic fractures healing well, full recovery anticipated in 2-3 months. Pain managed with Robaxin  and tramadol. Robaxin  causes nausea. Balance issues due to arthritis and pending hip replacement. - Order home health physical therapy evaluation. - Schedule follow-up appointment in three weeks for repeat x-rays.  Total face to face encounter time was greater than 25 minutes and over half of this time was spent in counseling and/or coordination of care.  Follow-Up Instructions: Return in about 3 weeks (around 06/07/2024).   Orders:  Orders Placed This Encounter  Procedures   XR Pelvis 1-2 Views   No orders of the defined types were placed in this encounter.    Subjective: Chief Complaint  Patient presents with   Right Hip - Pain    DOI 04/25/2024    HPI  Review of Systems  Constitutional: Negative.   HENT: Negative.    Eyes: Negative.   Respiratory: Negative.    Cardiovascular: Negative.   Endocrine: Negative.   Musculoskeletal: Negative.   Neurological: Negative.   Hematological: Negative.   Psychiatric/Behavioral: Negative.    All other systems reviewed and are negative.    Objective: Vital Signs: There were no vitals taken for this visit.  Physical Exam Vitals and nursing note reviewed.  Constitutional:      Appearance: She is well-developed.  HENT:     Head: Atraumatic.     Nose: Nose normal.  Eyes:  Extraocular Movements: Extraocular movements intact.  Cardiovascular:     Pulses: Normal pulses.  Pulmonary:     Effort: Pulmonary effort is normal.  Abdominal:     Palpations: Abdomen is soft.  Musculoskeletal:     Cervical back: Neck supple.  Skin:    General: Skin is warm.     Capillary Refill: Capillary refill takes less than 2 seconds.  Neurological:     Mental Status: She is alert. Mental status is at baseline.  Psychiatric:        Behavior: Behavior normal.        Thought Content: Thought  content normal.        Judgment: Judgment normal.     Ortho Exam  Specialty Comments:  No specialty comments available.  Imaging: XR Pelvis 1-2 Views Result Date: 05/17/2024 X-rays of the pelvis show advanced degenerative joint disease of the right hip.  Pelvic fractures are healing well.    PMFS History: Patient Active Problem List   Diagnosis Date Noted   Constipation 08/04/2022   Primary osteoarthritis of right hip 06/23/2022   Chronic GI bleeding 06/21/2019   Past Medical History:  Diagnosis Date   A-fib [facility])    Hypertension    Vertigo     No family history on file.  Past Surgical History:  Procedure Laterality Date   BUNIONECTOMY     april 2023 was the 2nd surgery   ENTEROSCOPY N/A 02/12/2021   Procedure: ENTEROSCOPY;  Surgeon: Rollin Dover, MD;  Location: [facility];  Service: Endoscopy;  Laterality: N/A;   ESOPHAGOGASTRODUODENOSCOPY (EGD) WITH PROPOFOL  N/A 07/26/2019   Procedure: ESOPHAGOGASTRODUODENOSCOPY (EGD) WITH PROPOFOL ;  Surgeon: Rollin Dover, MD;  Location: [facility];  Service: Endoscopy;  Laterality: N/A;   HOT HEMOSTASIS N/A 07/26/2019   Procedure: HOT HEMOSTASIS (ARGON PLASMA COAGULATION/BICAP);  Surgeon: Rollin Dover, MD;  Location: [facility];  Service: Endoscopy;  Laterality: N/A;   Social History   Occupational History   Not on file  Tobacco Use   Smoking status: Never   Smokeless tobacco: Never  Vaping Use   Vaping status: Never Used  Substance and Sexual Activity   Alcohol use: No   Drug use: No   Sexual activity: Not on file

## 2024-05-20 NOTE — Addendum Note (Signed)
 Addended by: Shandell Jallow on: 05/20/2024 12:26 PM   Modules accepted: Orders

## 2024-06-07 ENCOUNTER — Other Ambulatory Visit (INDEPENDENT_AMBULATORY_CARE_PROVIDER_SITE_OTHER): Payer: Self-pay

## 2024-06-07 ENCOUNTER — Encounter: Payer: Self-pay | Admitting: Orthopaedic Surgery

## 2024-06-07 ENCOUNTER — Ambulatory Visit: Admitting: Orthopaedic Surgery

## 2024-06-07 DIAGNOSIS — M25551 Pain in right hip: Secondary | ICD-10-CM | POA: Diagnosis not present

## 2024-06-07 NOTE — Progress Notes (Signed)
   Office Visit Note   Patient: Melissa Brennan           Date of Birth: 04/03/1946           MRN: [id number] Visit Date: 06/07/2024              Requested by: Levern Hutching, MD [phone] [address] [place],  [place] [address] PCP: Levern Hutching, MD   Assessment & Plan: Visit Diagnoses:  1. Pain in right hip     Plan: History of Present Illness A 78 year old female presents for follow-up of pelvic fracture.  She is six weeks post-injury and reports significant improvement. She is able to walk and perform physical therapy at home. She has been discharged from home physical therapy services after attending two to three times a week.  She occasionally uses Tylenol  for pain, which is primarily located in her thigh and is intermittent.  Ambulating without assistive devices.  Gait pattern is back to baseline.  Slightly antalgic due to severe OA.  Results RADIOLOGY Hip X-ray: No visible fracture, indicating significant healing.  Assessment and Plan Healing pelvic fracture, preexisting right hip OA Six weeks post-injury, fracture healing well with significant improvement on x-rays. Intermittent thigh pain likely due to muscle weakness from reduced activity. - Continue home physical therapy. - Re-evaluate in six weeks with repeat imaging - Order CT scan in three months to assess complete fracture healing. - Consider hip replacement surgery if CT scan confirms complete healing.  Follow-Up Instructions: Return in about 6 weeks (around 07/19/2024).   Orders:  Orders Placed This Encounter  Procedures   XR Pelvis 1-2 Views   No orders of the defined types were placed in this encounter.   Subjective: Chief Complaint  Patient presents with   Right Hip - Pain    DOI 04/25/2024   Imaging: XR Pelvis 1-2 Views Result Date: 06/07/2024 X-rays of the pelvis including inlet, outlet, Judet views demonstrate healed pelvic fractures.  X-rays redemonstrate severe osteoarthritis with  protrusio deformity.

## 2024-07-23 ENCOUNTER — Other Ambulatory Visit (INDEPENDENT_AMBULATORY_CARE_PROVIDER_SITE_OTHER): Payer: Self-pay

## 2024-07-23 ENCOUNTER — Ambulatory Visit (INDEPENDENT_AMBULATORY_CARE_PROVIDER_SITE_OTHER): Admitting: Orthopaedic Surgery

## 2024-07-23 ENCOUNTER — Telehealth: Payer: Self-pay

## 2024-07-23 DIAGNOSIS — M1611 Unilateral primary osteoarthritis, right hip: Secondary | ICD-10-CM

## 2024-07-23 NOTE — Telephone Encounter (Signed)
 Patient given surgical clearance form for Dr.Harwani. Aware that we must receive clearance form back before being able to proceed with scheduling surgery.

## 2024-07-23 NOTE — Progress Notes (Signed)
 Office Visit Note   Patient: Melissa Brennan           Date of Birth: 01/07/1946           MRN: [id number] Visit Date: 07/23/2024              Requested by: Levern Hutching, MD [phone] [address] [place],  [place] [address] PCP: Levern Hutching, MD   Assessment & Plan: Visit Diagnoses:  1. Primary osteoarthritis of right hip     Plan: History of Present Illness Melissa Brennan is a 78 year old female with right hip arthritis and pubic ramus fractures who presents for follow-up regarding hip replacement surgery.  She is 3 months status post pubic ramus fractures that have been treated nonoperatively.  She experiences occasional low-level discomfort, managed with Tylenol , and some discomfort during physical therapy. Her fractures appear healed three months post-injury. She has severe hip arthritis and was advised to undergo hip replacement due to a protrusio deformity. She is on Eliquis  and amiodarone . She is concerned about her right leg feeling slightly shorter.  Exam of her right hip shows minimal range of motion.  Pain with hip flexion.  Leg length discrepancy.  Assessment and Plan Severe right hip osteoarthritis with protrusio deformity and history of right hip fracture Severe osteoarthritis with protrusio deformity. Significant degeneration with occasional discomfort requiring Tylenol , especially during therapy. Risk of ambulation loss without hip replacement. Right hip fracture healed. On Eliquis  for cardiac condition managed by Dr. Levern. - Order CT scan of pelvis for preoperative planning. - Obtain surgical clearance from Dr. Levern. - Schedule hip replacement surgery post-Christmas. - Ensure postoperative care support availability.  Impression is severe right hip degenerative joint disease secondary to Osteoarthritis with protrusio.  Patient has attempted conservative treatment for at least 6 consecutive weeks within the past 12 weeks, including but not limited to  physical therapy, home exercise program, NSAIDs, activity modification, and/or corticosteroid injections. Despite these efforts, symptoms have not improved or have worsened. Conservative measures have been deemed unsuccessful at this time. After a detailed discussion covering diagnosis and treatment options--including the risks, benefits, alternatives, and potential complications of surgical and nonsurgical management--the patient elected to proceed with surgery.  Current anticoagulants: Eliquis  (apixaban ) daily Postop anticoagulation: Eliquis  Diabetic: No  Prior DVT/PE: No Tobacco use: No Clearances needed for surgery: Harwani Anticipate discharge dispo: home   Follow-Up Instructions: No follow-ups on file.   Orders:  Orders Placed This Encounter  Procedures   XR Pelvis 1-2 Views   CT HIP RIGHT WO CONTRAST   No orders of the defined types were placed in this encounter.     Procedures: No procedures performed   Clinical Data: No additional findings.   Subjective: Chief Complaint  Patient presents with   Right Hip - Pain    DOI 04/25/2024    HPI  Review of Systems  Constitutional: Negative.   HENT: Negative.    Eyes: Negative.   Respiratory: Negative.    Cardiovascular: Negative.   Endocrine: Negative.   Musculoskeletal: Negative.   Neurological: Negative.   Hematological: Negative.   Psychiatric/Behavioral: Negative.    All other systems reviewed and are negative.    Objective: Vital Signs: There were no vitals taken for this visit.  Physical Exam Vitals and nursing note reviewed.  Constitutional:      Appearance: She is well-developed.  HENT:     Head: Atraumatic.     Nose: Nose normal.  Eyes:  Extraocular Movements: Extraocular movements intact.  Cardiovascular:     Pulses: Normal pulses.  Pulmonary:     Effort: Pulmonary effort is normal.  Abdominal:     Palpations: Abdomen is soft.  Musculoskeletal:     Cervical back: Neck supple.   Skin:    General: Skin is warm.     Capillary Refill: Capillary refill takes less than 2 seconds.  Neurological:     Mental Status: She is alert. Mental status is at baseline.  Psychiatric:        Behavior: Behavior normal.        Thought Content: Thought content normal.        Judgment: Judgment normal.     Ortho Exam  Specialty Comments:  No specialty comments available.  Imaging: XR Pelvis 1-2 Views Result Date: 07/23/2024 X-rays of the pelvis show advanced degenerative joint disease with bone on bone joint space narrowing with protrusio deformity.  Pubic rami fractures have healed.    PMFS History: Patient Active Problem List   Diagnosis Date Noted   Constipation 08/04/2022   Primary osteoarthritis of right hip 06/23/2022   Chronic GI bleeding 06/21/2019   Past Medical History:  Diagnosis Date   A-fib [facility])    Hypertension    Vertigo     No family history on file.  Past Surgical History:  Procedure Laterality Date   BUNIONECTOMY     april 2023 was the 2nd surgery   ENTEROSCOPY N/A 02/12/2021   Procedure: ENTEROSCOPY;  Surgeon: Rollin Dover, MD;  Location: [facility];  Service: Endoscopy;  Laterality: N/A;   ESOPHAGOGASTRODUODENOSCOPY (EGD) WITH PROPOFOL  N/A 07/26/2019   Procedure: ESOPHAGOGASTRODUODENOSCOPY (EGD) WITH PROPOFOL ;  Surgeon: Rollin Dover, MD;  [facility];  Service: Endoscopy;  Laterality: N/A;   HOT HEMOSTASIS N/A 07/26/2019   Procedure: HOT HEMOSTASIS (ARGON PLASMA COAGULATION/BICAP);  Surgeon: Rollin Dover, MD;  Location: [facility];  Service: Endoscopy;  Laterality: N/A;   Social History   Occupational History   Not on file  Tobacco Use   Smoking status: Never   Smokeless tobacco: Never  Vaping Use   Vaping status: Never Used  Substance and Sexual Activity   Alcohol use: No   Drug use: No   Sexual activity: Not on file

## 2024-07-29 ENCOUNTER — Encounter: Payer: Self-pay | Admitting: Radiology

## 2024-07-31 ENCOUNTER — Ambulatory Visit
Admission: RE | Admit: 2024-07-31 | Discharge: 2024-07-31 | Disposition: A | Discharge status: Home / Self Care | Source: Ambulatory Visit | Attending: Orthopaedic Surgery | Admitting: Orthopaedic Surgery

## 2024-07-31 DIAGNOSIS — M1611 Unilateral primary osteoarthritis, right hip: Secondary | ICD-10-CM

## 2024-08-05 ENCOUNTER — Ambulatory Visit: Payer: Self-pay | Admitting: Orthopaedic Surgery

## 2024-08-05 NOTE — Progress Notes (Signed)
 Did I give you a surgery sheet for her?

## 2024-09-25 ENCOUNTER — Other Ambulatory Visit: Payer: Self-pay | Admitting: Physician Assistant

## 2024-09-25 MED ORDER — METHOCARBAMOL 500 MG PO TABS: 500.0000 mg | ORAL_TABLET | Freq: Two times a day (BID) | ORAL | 2 refills | Status: AC | PRN

## 2024-09-25 MED ORDER — OXYCODONE-ACETAMINOPHEN 5-325 MG PO TABS: 1.0000 | ORAL_TABLET | Freq: Three times a day (TID) | ORAL | 0 refills | Status: DC | PRN

## 2024-09-25 MED ORDER — ONDANSETRON HCL 4 MG PO TABS: 4.0000 mg | ORAL_TABLET | Freq: Three times a day (TID) | ORAL | 0 refills | Status: AC | PRN

## 2024-09-25 MED ORDER — DOCUSATE SODIUM 100 MG PO CAPS: 100.0000 mg | ORAL_CAPSULE | Freq: Every day | ORAL | 2 refills | Status: DC | PRN

## 2024-09-30 ENCOUNTER — Telehealth: Payer: Self-pay | Admitting: Orthopaedic Surgery

## 2024-09-30 NOTE — Telephone Encounter (Signed)
 Pt walked into office request to speak with someone regarding her upcoming surgery. Pt states her pcp told her that she needed blood work done before her surgery. Also states she has been waiting on a return call from [facility], since last week. Please advise and call patient

## 2024-10-01 ENCOUNTER — Telehealth: Payer: Self-pay | Admitting: Orthopaedic Surgery

## 2024-10-01 NOTE — Telephone Encounter (Signed)
 Patient left message on voicemail asking for her surgery time and has questions about her medication and lab work.  I have returned patient's call letting her know the hospital will be calling to make the pre-op appointment and may be having difficulty reaching her at the number she had provided us  with.  I have asked this patient to call our office and I have provided her with my name and direct number.

## 2024-10-02 NOTE — Progress Notes (Signed)
 Surgical Instructions   Your procedure is scheduled on Monday October 07, 2024. Report to [facility] Main Entrance A at 7:30 A.M., then check in with the Admitting office. Any questions or running late day of surgery: call [phone]  Questions prior to your surgery date: call [phone], Monday-Friday, 8am-4pm. If you experience any cold or flu symptoms such as cough, fever, chills, shortness of breath, etc. between now and your scheduled surgery, please notify us  at the above number.     Remember:  Do not eat after midnight the night before your surgery  You may drink clear liquids until 7:00 the morning of your surgery.   Clear liquids allowed are: Water, Non-Citrus Juices (without pulp), Carbonated Beverages, Clear Tea (no milk, honey, etc.), Black Coffee Only (NO MILK, CREAM OR POWDERED CREAMER of any kind), and Gatorade.  Patient Instructions  The night before surgery:  No food after midnight. ONLY clear liquids after midnight  The day of surgery (if you do NOT have diabetes):  Drink ONE (1) Pre-Surgery Clear Ensure by 7:00 the morning of surgery. Drink in one sitting. Do not sip.  This drink was given to you during your hospital  pre-op appointment visit.  Nothing else to drink after completing the  Pre-Surgery Clear Ensure.         If you have questions, please contact your surgeons office.   Take these medicines the morning of surgery with A SIP OF WATER  amiodarone  (PACERONE )  omeprazole (PRILOSEC)  rosuvastatin  (CRESTOR )   May take these medicines IF NEEDED: acetaminophen  (TYLENOL )  methocarbamol  (ROBAXIN )  ondansetron  (ZOFRAN )   PER YOUR CARDIOLOGIST'S INSTRUCTIONS, PLEASE HOLD YOUR apixaban  (ELIQUIS ) 3 DAYS PRIOR TO SURGERY WITH THE LAST DOSE BEING 10/03/2024.     One week prior to surgery, STOP taking any Aspirin (unless otherwise instructed by your surgeon) Aleve, Naproxen, Ibuprofen , Motrin , Advil , Goody's, BC's, all herbal medications, fish oil, and  non-prescription vitamins.  This includes your diclofenac  Sodium (VOLTAREN ) 1 % GEL                      Do NOT Smoke (Tobacco/Vaping) for 24 hours prior to your procedure.  If you use a CPAP at night, you may bring your mask/headgear for your overnight stay.   You will be asked to remove any contacts, glasses, piercing's, hearing aid's, dentures/partials prior to surgery. Please bring cases for these items if needed.    Patients discharged the day of surgery will not be allowed to drive home, and someone needs to stay with them for 24 hours.  SURGICAL WAITING ROOM VISITATION Patients may have no more than 2 support people in the waiting area - these visitors may rotate.   Pre-op nurse will coordinate an appropriate time for 1 ADULT support person, who may not rotate, to accompany patient in pre-op.  Children under the age of 3 must have an adult with them who is not the patient and must remain in the main waiting area with an adult.  If the patient needs to stay at the hospital during part of their recovery, the visitor guidelines for inpatient rooms apply.  Please refer to the [facility] website for the visitor guidelines for any additional information.   If you received a COVID test during your pre-op visit  it is requested that you wear a mask when out in public, stay away from anyone that may not be feeling well and notify your surgeon if you develop symptoms. If you  have been in contact with anyone that has tested positive in the last 10 days please notify you surgeon.      Pre-operative 4 CHG Bathing Instructions   You can play a key role in reducing the risk of infection after surgery. Your skin needs to be as free of germs as possible. You can reduce the number of germs on your skin by washing with CHG (chlorhexidine gluconate) soap before surgery. CHG is an antiseptic soap that kills germs and continues to kill germs even after washing.   DO NOT use if you have an allergy to  chlorhexidine/CHG or antibacterial soaps. If your skin becomes reddened or irritated, stop using the CHG and notify one of our RNs at [phone].   Please shower with the CHG soap starting 4 days before surgery using the following schedule:     Please keep in mind the following:  DO NOT shave, including legs and underarms, starting the day of your first shower.   Place clean sheets on your bed the day you start using CHG soap. Use a clean washcloth (not used since being washed) for each shower. DO NOT sleep with pets once you start using the CHG.   CHG Shower Instructions:  Wash your face and private area with normal soap. If you choose to wash your hair, wash first with your normal shampoo.  After you use shampoo/soap, rinse your hair and body thoroughly to remove shampoo/soap residue.  Turn the water OFF and apply  bottle of CHG soap to a CLEAN washcloth.  Apply CHG soap ONLY FROM YOUR NECK DOWN TO YOUR TOES (washing for 3-5 minutes)  DO NOT use CHG soap on face, private areas, open wounds, or sores.  Pay special attention to the area where your surgery is being performed.  If you are having back surgery, having someone wash your back for you may be helpful. Wait 2 minutes after CHG soap is applied, then you may rinse off the CHG soap.  Pat dry with a clean towel  Put on clean clothes/pajamas   If you choose to wear lotion, please use ONLY the CHG-compatible lotions that are listed below.  Additional instructions for the day of surgery:  If you choose, you may shower the morning of surgery with an antibacterial soap.  DO NOT APPLY any lotions, deodorants  or perfumes.   Do not bring valuables to the hospital. [facility] is not responsible for any belongings/valuables. Do not wear nail polish, gel polish, artificial nails, or any other type of covering on natural nails (fingers and toes) Do not wear jewelry or makeup Put on clean/comfortable clothes.  Please brush your teeth.   Ask your nurse before applying any prescription medications to the skin.     CHG Compatible Lotions   Aveeno Moisturizing lotion  Cetaphil Moisturizing Cream  Cetaphil Moisturizing Lotion  Clairol Herbal Essence Moisturizing Lotion, Dry Skin  Clairol Herbal Essence Moisturizing Lotion, Extra Dry Skin  Clairol Herbal Essence Moisturizing Lotion, Normal Skin  Curel Age Defying Therapeutic Moisturizing Lotion with Alpha Hydroxy  Curel Extreme Care Body Lotion  Curel Soothing Hands Moisturizing Hand Lotion  Curel Therapeutic Moisturizing Cream, Fragrance-Free  Curel Therapeutic Moisturizing Lotion, Fragrance-Free  Curel Therapeutic Moisturizing Lotion, Original Formula  Eucerin Daily Replenishing Lotion  Eucerin Dry Skin Therapy Plus Alpha Hydroxy Crme  Eucerin Dry Skin Therapy Plus Alpha Hydroxy Lotion  Eucerin Original Crme  Eucerin Original Lotion  Eucerin Plus Crme Eucerin Plus Lotion  Eucerin TriLipid Replenishing Lotion  Keri Anti-Bacterial Hand Lotion  Keri Deep Conditioning Original Lotion Dry Skin Formula Softly Scented  Keri Deep Conditioning Original Lotion, Fragrance Free Sensitive Skin Formula  Keri Lotion Fast Absorbing Fragrance Free Sensitive Skin Formula  Keri Lotion Fast Absorbing Softly Scented Dry Skin Formula  Keri Original Lotion  Keri Skin Renewal Lotion Keri Silky Smooth Lotion  Keri Silky Smooth Sensitive Skin Lotion  Nivea Body Creamy Conditioning Oil  Nivea Body Extra Enriched Lotion  Nivea Body Original Lotion  Nivea Body Sheer Moisturizing Lotion Nivea Crme  Nivea Skin Firming Lotion  NutraDerm 30 Skin Lotion  NutraDerm Skin Lotion  NutraDerm Therapeutic Skin Cream  NutraDerm Therapeutic Skin Lotion  ProShield Protective Hand Cream  Provon moisturizing lotion  Please read over the following fact sheets that you were given.

## 2024-10-03 ENCOUNTER — Encounter (HOSPITAL_COMMUNITY): Payer: Self-pay

## 2024-10-03 ENCOUNTER — Other Ambulatory Visit: Payer: Self-pay

## 2024-10-03 ENCOUNTER — Encounter (HOSPITAL_COMMUNITY)
Admission: RE | Admit: 2024-10-03 | Discharge: 2024-10-03 | Disposition: A | Discharge status: Home / Self Care | Source: Ambulatory Visit | Attending: Orthopaedic Surgery | Admitting: Orthopaedic Surgery

## 2024-10-03 VITALS — BP 131/57 | HR 76 | Temp 98.5°F | Resp 18 | Ht 65.0 in | Wt 113.5 lb

## 2024-10-03 DIAGNOSIS — R42 Dizziness and giddiness: Secondary | ICD-10-CM | POA: Insufficient documentation

## 2024-10-03 DIAGNOSIS — Z7901 Long term (current) use of anticoagulants: Secondary | ICD-10-CM | POA: Insufficient documentation

## 2024-10-03 DIAGNOSIS — E042 Nontoxic multinodular goiter: Secondary | ICD-10-CM | POA: Diagnosis not present

## 2024-10-03 DIAGNOSIS — Z01812 Encounter for preprocedural laboratory examination: Secondary | ICD-10-CM | POA: Diagnosis present

## 2024-10-03 DIAGNOSIS — I10 Essential (primary) hypertension: Secondary | ICD-10-CM | POA: Insufficient documentation

## 2024-10-03 DIAGNOSIS — M1611 Unilateral primary osteoarthritis, right hip: Secondary | ICD-10-CM | POA: Insufficient documentation

## 2024-10-03 DIAGNOSIS — E785 Hyperlipidemia, unspecified: Secondary | ICD-10-CM | POA: Diagnosis not present

## 2024-10-03 DIAGNOSIS — I4891 Unspecified atrial fibrillation: Secondary | ICD-10-CM | POA: Insufficient documentation

## 2024-10-03 DIAGNOSIS — Z01818 Encounter for other preprocedural examination: Secondary | ICD-10-CM

## 2024-10-03 HISTORY — DX: Hyperlipidemia, unspecified: E78.5

## 2024-10-03 LAB — CBC
HCT: 39.8 % (ref 36.0–46.0)
Hemoglobin: 13 g/dL (ref 12.0–15.0)
MCH: 29.2 pg (ref 26.0–34.0)
MCHC: 32.7 g/dL (ref 30.0–36.0)
MCV: 89.4 fL (ref 80.0–100.0)
Platelets: 259 K/uL (ref 150–400)
RBC: 4.45 MIL/uL (ref 3.87–5.11)
RDW: 13 % (ref 11.5–15.5)
WBC: 10.2 K/uL (ref 4.0–10.5)
nRBC: 0 % (ref 0.0–0.2)

## 2024-10-03 LAB — BASIC METABOLIC PANEL WITH GFR
Anion gap: 8 (ref 5–15)
BUN: 15 mg/dL (ref 8–23)
CO2: 30 mmol/L (ref 22–32)
Calcium: 9 mg/dL (ref 8.9–10.3)
Chloride: 103 mmol/L (ref 98–111)
Creatinine, Ser: 0.74 mg/dL (ref 0.44–1.00)
GFR, Estimated: >60 mL/min
Glucose, Bld: 105 mg/dL — ABNORMAL HIGH (ref 70–99)
Potassium: 3.9 mmol/L (ref 3.5–5.1)
Sodium: 141 mmol/L (ref 135–145)

## 2024-10-03 LAB — TYPE AND SCREEN
ABO/RH(D): B POS
Antibody Screen: NEGATIVE

## 2024-10-03 LAB — SURGICAL PCR SCREEN
MRSA, PCR: NEGATIVE
Staphylococcus aureus: NEGATIVE

## 2024-10-03 NOTE — Progress Notes (Signed)
 PCP/ Cardiologist - Pt reports she uses Dr. Levern as both PCP and cardiologist  PPM/ICD - n/a Device Orders -  Rep Notified -   Chest x-ray -  EKG - requested from [facility] Stress Test - denies ECHO - denies Cardiac Cath - denies  Sleep Study - denies CPAP -   Fasting Blood Sugar - n/a  Checks Blood Sugar _____ times a day  Last dose of GLP1 agonist-  n/a GLP1 instructions:   Blood Thinner Instructions: Eliquis , Hold 3 days Aspirin Instructions:  ERAS Protcol - clears until 7am PRE-SURGERY Ensure or G2- pre-surgery provided  COVID TEST- n/a   Anesthesia review: Yes, pending records  Patient denies shortness of breath, fever, cough and chest pain at PAT appointment

## 2024-10-04 NOTE — Anesthesia Preprocedure Evaluation (Signed)
"                                    Anesthesia Evaluation  Patient identified by MRN, date of birth, ID band Patient awake    Reviewed: Allergy & Precautions, NPO status , Patient's Chart, lab work & pertinent test results  History of Anesthesia Complications Negative for: history of anesthetic complications  Airway Mallampati: II  TM Distance: >3 FB Neck ROM: Full    Dental  (+) Dental Advisory Given, Implants   Pulmonary neg pulmonary ROS   Pulmonary exam normal        Cardiovascular hypertension, Pt. on medications + dysrhythmias Atrial Fibrillation  Rhythm:Irregular Rate:Normal - Systolic murmurs  '19 TTE - EF 55 to 60%. Mild mitral regurgitation seen. Mild tricuspid regurgitation is seen.    Neuro/Psych  Vertigo   negative psych ROS   GI/Hepatic negative GI ROS, Neg liver ROS,,,  Endo/Other  negative endocrine ROS    Renal/GU negative Renal ROS     Musculoskeletal  (+) Arthritis ,    Abdominal   Peds  Hematology  On eliquis     Anesthesia Other Findings   Reproductive/Obstetrics                              Anesthesia Physical Anesthesia Plan  ASA: 3  Anesthesia Plan: Spinal   Post-op Pain Management: Tylenol  PO (pre-op)*   Induction:   PONV Risk Score and Plan: 2 and Treatment may vary due to age or medical condition and Propofol  infusion  Airway Management Planned: Natural Airway and Simple Face Mask  Additional Equipment: None  Intra-op Plan:   Post-operative Plan:   Informed Consent: I have reviewed the patients History and Physical, chart, labs and discussed the procedure including the risks, benefits and alternatives for the proposed anesthesia with the patient or authorized representative who has indicated his/her understanding and acceptance.       Plan Discussed with: CRNA and Anesthesiologist  Anesthesia Plan Comments: (Labs reviewed, platelets acceptable. Discussed risks and  benefits of spinal, including spinal/epidural hematoma, infection, failed block, and PDPH. Patient expressed understanding and wished to proceed.  PAT note written 10/04/2024 by Dontrez Pettis, PA-C.)         Anesthesia Quick Evaluation  "

## 2024-10-04 NOTE — Progress Notes (Signed)
 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  History includes never smoker, HTN, atrial fibrillation, HLD, vertigo, thyroid  nodule (left biopsy benign 11/29/2022).  Dr. Levern classified her as low risk for the planned procedure and gave permission to hold Eliquis  for 3 days prior to surgery (scanned under Media tab). He last saw her on 09/30/2024. By his exam she was maintaining SR. Last EKG sent from his office was from 02/27/2023 ans showed NSR, norma ECG. She did have a more recent EKG through [facility] on 04/25/2024 that showed SR at 70 bpm. Tracing requested. If not received, can update EKG on arrival.  VS: BP (!) 131/57   Pulse 76   Temp 36.9 C   Resp 18   Ht 5' 5 (1.651 m)   Wt 51.5 kg   SpO2 100%   BMI 18.89 kg/m    PROVIDERS: Levern Hutching, MD is her PCP and cardiologist Tommas Pears, MD is endocrinologist. TSH 0.644 on 10/04/2023.    LABS: Labs reviewed: Acceptable for surgery. (all labs ordered are listed, but only abnormal results are displayed)  Labs Reviewed  BASIC METABOLIC PANEL WITH GFR - Abnormal; Notable for the following components:      Result Value   Glucose, Bld 105 (*)    All other components within normal limits  SURGICAL PCR SCREEN  CBC  TYPE AND SCREEN    IMAGES: CT Right Hip 07/31/2024: Consistent with benign follicular nodule (Bethesda category II)   1V CXR 04/25/2024 ([facility]): IMPRESSION: Normal single view chest.   US  Thyroid  11/02/2022: IMPRESSION: 1. Multiple thyroid  nodules. 2. Nodule #4 in the left inferior thyroid  lobe is a  moderately suspicious nodule and this nodule meets criteria for ultrasound-guided biopsy. S/p left inferior thyroid  nodule FNA 11/29/2022: Consistent with benign follicular nodule (Bethesda category II)    EKG: EKG 04/25/2024 ([facility]): Per CE Narrative: SINUS RHYTHM@70   normal axis  No STEMI  Electronically Signed On 04-25-2024 20:44:25 EDT by LYNWOOD COUNTRYMAN    CV: Echo 12/27/2017 ([facility], scanned under Media tab): Summary: 2D echo and Doppler demonstrate normal left ventricular size.  Left ventricular systolic function is normal with normal ejection fraction.  Estimated EF 55 to 60%.  The left ventricle shows normal contractility in size.  Left ventricular mass and diastolic function appear normal. Mild mitral regurgitation seen. Mild tricuspid regurgitation is seen. No intracardiac masses or thrombi are seen. No intracardiac shunting is found. No pericardial effusion is seen with no suggestion of pericardial tamponade.   Past Medical History:  Diagnosis Date   A-fib [facility])    Hyperlipidemia    Hypertension    Vertigo     Past Surgical History:  Procedure Laterality Date   BUNIONECTOMY     april 2023 was the 2nd surgery   ENTEROSCOPY N/A 02/12/2021   Procedure: ENTEROSCOPY;  Surgeon: Rollin Dover, MD;  Location: [facility];  Service: Endoscopy;  Laterality: N/A;   ESOPHAGOGASTRODUODENOSCOPY (EGD) WITH PROPOFOL  N/A 07/26/2019   Procedure: ESOPHAGOGASTRODUODENOSCOPY (EGD) WITH PROPOFOL ;  Surgeon: Rollin Dover, MD;  Location: [facility];  Service: Endoscopy;  Laterality: N/A;  HOT HEMOSTASIS N/A 07/26/2019   Procedure: HOT HEMOSTASIS (ARGON PLASMA COAGULATION/BICAP);  Surgeon: Rollin Dover, MD;  Location: [facility];  Service: Endoscopy;  Laterality: N/A;    MEDICATIONS:  apixaban  (ELIQUIS ) 5 MG TABS tablet   docusate sodium  (COLACE) 100 MG capsule   methocarbamol  (ROBAXIN ) 500 MG tablet   ondansetron  (ZOFRAN ) 4 MG tablet   oxyCODONE -acetaminophen  (PERCOCET)  5-325 MG tablet   acetaminophen  (TYLENOL ) 325 MG tablet   amiodarone  (PACERONE ) 200 MG tablet   diclofenac  Sodium (VOLTAREN ) 1 % GEL   losartan  (COZAAR ) 25 MG tablet   methocarbamol  (ROBAXIN ) 500 MG tablet   omeprazole (PRILOSEC) 20 MG capsule   polyethylene glycol (MIRALAX  / GLYCOLAX ) 17 g packet   rosuvastatin  (CRESTOR ) 20 MG tablet   No current facility-administered medications for this encounter.    Isaiah Ruder, PA-C Surgical Short Stay/Anesthesiology [facility] Phone [phone] [facility] Phone [phone] 10/04/2024 10:16 AM

## 2024-10-07 ENCOUNTER — Encounter (HOSPITAL_COMMUNITY): Payer: Self-pay | Admitting: Vascular Surgery

## 2024-10-07 ENCOUNTER — Other Ambulatory Visit: Payer: Self-pay

## 2024-10-07 ENCOUNTER — Observation Stay (HOSPITAL_COMMUNITY)

## 2024-10-07 ENCOUNTER — Observation Stay (HOSPITAL_COMMUNITY)
Admission: RE | Admit: 2024-10-07 | Discharge: 2024-10-08 | Disposition: A | Attending: Orthopaedic Surgery | Admitting: Orthopaedic Surgery

## 2024-10-07 ENCOUNTER — Encounter (HOSPITAL_COMMUNITY): Payer: Self-pay | Admitting: Orthopaedic Surgery

## 2024-10-07 ENCOUNTER — Ambulatory Visit (HOSPITAL_COMMUNITY)

## 2024-10-07 ENCOUNTER — Encounter (HOSPITAL_COMMUNITY): Admission: RE | Disposition: A | Payer: Self-pay | Source: Home / Self Care | Attending: Orthopaedic Surgery

## 2024-10-07 DIAGNOSIS — M1611 Unilateral primary osteoarthritis, right hip: Secondary | ICD-10-CM | POA: Diagnosis not present

## 2024-10-07 DIAGNOSIS — M25551 Pain in right hip: Secondary | ICD-10-CM | POA: Diagnosis present

## 2024-10-07 DIAGNOSIS — Z96641 Presence of right artificial hip joint: Secondary | ICD-10-CM

## 2024-10-07 DIAGNOSIS — I4891 Unspecified atrial fibrillation: Secondary | ICD-10-CM

## 2024-10-07 DIAGNOSIS — Z79899 Other long term (current) drug therapy: Secondary | ICD-10-CM | POA: Insufficient documentation

## 2024-10-07 DIAGNOSIS — I10 Essential (primary) hypertension: Secondary | ICD-10-CM | POA: Insufficient documentation

## 2024-10-07 HISTORY — PX: TOTAL HIP ARTHROPLASTY: SHX124

## 2024-10-07 SURGERY — ARTHROPLASTY, HIP, TOTAL, ANTERIOR APPROACH
Anesthesia: Spinal | Site: Hip | Laterality: Right

## 2024-10-07 MED ORDER — AMIODARONE HCL 200 MG PO TABS
100.0000 mg | ORAL_TABLET | Freq: Every day | ORAL | Status: DC
  Administered 2024-10-08: 100 mg via ORAL
  Filled 2024-10-07: qty 1

## 2024-10-07 MED ORDER — ONDANSETRON HCL 4 MG/2ML IJ SOLN: 4.0000 mg | Freq: Four times a day (QID) | INTRAMUSCULAR | Status: DC | PRN

## 2024-10-07 MED ORDER — PHENOL 1.4 % MT LIQD: 1.0000 | OROMUCOSAL | Status: DC | PRN

## 2024-10-07 MED ORDER — CEFAZOLIN SODIUM-DEXTROSE 2-4 GM/100ML-% IV SOLN
2.0000 g | INTRAVENOUS | Status: AC
  Administered 2024-10-07: 2 g via INTRAVENOUS
  Filled 2024-10-07: qty 100

## 2024-10-07 MED ORDER — FENTANYL CITRATE (PF) 100 MCG/2ML IJ SOLN
INTRAMUSCULAR | Status: AC
  Filled 2024-10-07: qty 2

## 2024-10-07 MED ORDER — ACETAMINOPHEN 500 MG PO TABS
1000.0000 mg | ORAL_TABLET | Freq: Once | ORAL | Status: AC
  Administered 2024-10-07: 1000 mg via ORAL

## 2024-10-07 MED ORDER — METOCLOPRAMIDE HCL 5 MG PO TABS: 5.0000 mg | ORAL_TABLET | Freq: Three times a day (TID) | ORAL | Status: DC | PRN

## 2024-10-07 MED ORDER — TRANEXAMIC ACID 1000 MG/10ML IV SOLN
INTRAVENOUS | Status: DC | PRN
  Administered 2024-10-07: 2000 mg via TOPICAL

## 2024-10-07 MED ORDER — METHOCARBAMOL 1000 MG/10ML IJ SOLN: 500.0000 mg | Freq: Four times a day (QID) | INTRAMUSCULAR | Status: DC | PRN

## 2024-10-07 MED ORDER — SODIUM CHLORIDE 0.9 % IV SOLN: INTRAVENOUS | Status: DC

## 2024-10-07 MED ORDER — STERILE WATER FOR IRRIGATION IR SOLN
Status: DC | PRN
  Administered 2024-10-07 (×2): 1000 mL

## 2024-10-07 MED ORDER — ACETAMINOPHEN 500 MG PO TABS
1000.0000 mg | ORAL_TABLET | Freq: Four times a day (QID) | ORAL | Status: DC
  Administered 2024-10-07 – 2024-10-08 (×3): 1000 mg via ORAL
  Filled 2024-10-07 (×3): qty 2

## 2024-10-07 MED ORDER — PRONTOSAN WOUND IRRIGATION OPTIME
TOPICAL | Status: DC | PRN
  Administered 2024-10-07: 350 mL via TOPICAL

## 2024-10-07 MED ORDER — MAGNESIUM CITRATE PO SOLN: 1.0000 | Freq: Once | ORAL | Status: DC | PRN

## 2024-10-07 MED ORDER — LOSARTAN POTASSIUM 50 MG PO TABS
25.0000 mg | ORAL_TABLET | Freq: Every day | ORAL | Status: DC
  Administered 2024-10-07 – 2024-10-08 (×2): 25 mg via ORAL
  Filled 2024-10-07 (×2): qty 1

## 2024-10-07 MED ORDER — HYDROCODONE-ACETAMINOPHEN 7.5-325 MG PO TABS
1.0000 | ORAL_TABLET | Freq: Three times a day (TID) | ORAL | Status: DC | PRN
  Administered 2024-10-08: 1 via ORAL
  Filled 2024-10-07: qty 1

## 2024-10-07 MED ORDER — VANCOMYCIN HCL 1000 MG IV SOLR
INTRAVENOUS | Status: AC
  Filled 2024-10-07: qty 20

## 2024-10-07 MED ORDER — POVIDONE-IODINE 10 % EX SWAB
2.0000 | Freq: Once | CUTANEOUS | Status: AC
  Administered 2024-10-07: 2 via TOPICAL

## 2024-10-07 MED ORDER — HYDROCODONE-ACETAMINOPHEN 5-325 MG PO TABS
1.0000 | ORAL_TABLET | Freq: Three times a day (TID) | ORAL | Status: DC | PRN
  Administered 2024-10-07: 1 via ORAL
  Filled 2024-10-07: qty 1

## 2024-10-07 MED ORDER — SORBITOL 70 % SOLN: 30.0000 mL | Freq: Every day | Status: DC | PRN

## 2024-10-07 MED ORDER — ONDANSETRON HCL 4 MG/2ML IJ SOLN: 4.0000 mg | Freq: Once | INTRAMUSCULAR | Status: DC | PRN

## 2024-10-07 MED ORDER — LACTATED RINGERS IV SOLN: INTRAVENOUS | Status: DC

## 2024-10-07 MED ORDER — CHLORHEXIDINE GLUCONATE 0.12 % MT SOLN
15.0000 mL | Freq: Once | OROMUCOSAL | Status: AC
  Administered 2024-10-07: 15 mL via OROMUCOSAL
  Filled 2024-10-07: qty 15

## 2024-10-07 MED ORDER — SODIUM CHLORIDE 0.9 % IR SOLN
Status: DC | PRN
  Administered 2024-10-07: 1000 mL

## 2024-10-07 MED ORDER — MORPHINE SULFATE (PF) 2 MG/ML IV SOLN: 0.5000 mg | Freq: Four times a day (QID) | INTRAVENOUS | Status: DC | PRN

## 2024-10-07 MED ORDER — METOCLOPRAMIDE HCL 5 MG/ML IJ SOLN: 5.0000 mg | Freq: Three times a day (TID) | INTRAMUSCULAR | Status: DC | PRN

## 2024-10-07 MED ORDER — BUPIVACAINE-MELOXICAM ER 400-12 MG/14ML IJ SOLN
INTRAMUSCULAR | Status: AC
  Filled 2024-10-07: qty 1

## 2024-10-07 MED ORDER — OXYCODONE HCL 5 MG/5ML PO SOLN: 5.0000 mg | Freq: Once | ORAL | Status: DC | PRN

## 2024-10-07 MED ORDER — TRANEXAMIC ACID-NACL 1000-0.7 MG/100ML-% IV SOLN
1000.0000 mg | INTRAVENOUS | Status: AC
  Administered 2024-10-07: 1000 mg via INTRAVENOUS
  Filled 2024-10-07: qty 100

## 2024-10-07 MED ORDER — OXYCODONE HCL 5 MG PO TABS: 5.0000 mg | ORAL_TABLET | Freq: Once | ORAL | Status: DC | PRN

## 2024-10-07 MED ORDER — BUPIVACAINE IN DEXTROSE 0.75-8.25 % IT SOLN
INTRATHECAL | Status: DC | PRN
  Administered 2024-10-07: 1.6 mL via INTRATHECAL

## 2024-10-07 MED ORDER — PROPOFOL 10 MG/ML IV BOLUS
INTRAVENOUS | Status: DC | PRN
  Administered 2024-10-07 (×3): 20 mg via INTRAVENOUS
  Administered 2024-10-07: 100 ug/kg/min via INTRAVENOUS

## 2024-10-07 MED ORDER — APIXABAN 2.5 MG PO TABS
2.5000 mg | ORAL_TABLET | Freq: Two times a day (BID) | ORAL | Status: DC
  Administered 2024-10-08: 2.5 mg via ORAL
  Filled 2024-10-07: qty 1

## 2024-10-07 MED ORDER — MENTHOL 3 MG MT LOZG: 1.0000 | LOZENGE | OROMUCOSAL | Status: DC | PRN

## 2024-10-07 MED ORDER — DOCUSATE SODIUM 100 MG PO CAPS
100.0000 mg | ORAL_CAPSULE | Freq: Two times a day (BID) | ORAL | Status: DC
  Administered 2024-10-07 – 2024-10-08 (×3): 100 mg via ORAL
  Filled 2024-10-07 (×3): qty 1

## 2024-10-07 MED ORDER — METHOCARBAMOL 500 MG PO TABS
500.0000 mg | ORAL_TABLET | Freq: Four times a day (QID) | ORAL | Status: DC | PRN
  Administered 2024-10-07 – 2024-10-08 (×3): 500 mg via ORAL
  Filled 2024-10-07 (×3): qty 1

## 2024-10-07 MED ORDER — PHENYLEPHRINE HCL-NACL 20-0.9 MG/250ML-% IV SOLN
INTRAVENOUS | Status: DC | PRN
  Administered 2024-10-07: 40 ug/min via INTRAVENOUS

## 2024-10-07 MED ORDER — DIPHENHYDRAMINE HCL 12.5 MG/5ML PO ELIX
25.0000 mg | ORAL_SOLUTION | ORAL | Status: DC | PRN
  Administered 2024-10-08: 25 mg via ORAL
  Filled 2024-10-07: qty 10

## 2024-10-07 MED ORDER — ONDANSETRON HCL 4 MG/2ML IJ SOLN
INTRAMUSCULAR | Status: DC | PRN
  Administered 2024-10-07: 4 mg via INTRAVENOUS

## 2024-10-07 MED ORDER — ONDANSETRON HCL 4 MG PO TABS: 4.0000 mg | ORAL_TABLET | Freq: Four times a day (QID) | ORAL | Status: DC | PRN

## 2024-10-07 MED ORDER — BUPIVACAINE-MELOXICAM ER 400-12 MG/14ML IJ SOLN
INTRAMUSCULAR | Status: DC | PRN
  Administered 2024-10-07: 400 mg

## 2024-10-07 MED ORDER — DEXAMETHASONE SOD PHOSPHATE PF 10 MG/ML IJ SOLN
INTRAMUSCULAR | Status: DC | PRN
  Administered 2024-10-07: 10 mg via INTRAVENOUS

## 2024-10-07 MED ORDER — VANCOMYCIN HCL 1 G IV SOLR
INTRAVENOUS | Status: DC | PRN
  Administered 2024-10-07: 1000 mg via TOPICAL

## 2024-10-07 MED ORDER — PANTOPRAZOLE SODIUM 40 MG PO TBEC
40.0000 mg | DELAYED_RELEASE_TABLET | Freq: Every day | ORAL | Status: DC
  Administered 2024-10-07 – 2024-10-08 (×2): 40 mg via ORAL
  Filled 2024-10-07 (×2): qty 1

## 2024-10-07 MED ORDER — ALUM & MAG HYDROXIDE-SIMETH 200-200-20 MG/5ML PO SUSP: 30.0000 mL | ORAL | Status: DC | PRN

## 2024-10-07 MED ORDER — POLYETHYLENE GLYCOL 3350 17 G PO PACK
17.0000 g | PACK | Freq: Every day | ORAL | Status: DC
  Filled 2024-10-07 (×2): qty 1

## 2024-10-07 MED ORDER — DEXAMETHASONE SOD PHOSPHATE PF 10 MG/ML IJ SOLN
INTRAMUSCULAR | Status: AC
  Filled 2024-10-07: qty 1

## 2024-10-07 MED ORDER — TRANEXAMIC ACID 1000 MG/10ML IV SOLN
2000.0000 mg | INTRAVENOUS | Status: DC
  Filled 2024-10-07: qty 20

## 2024-10-07 MED ORDER — ACETAMINOPHEN 500 MG PO TABS
ORAL_TABLET | ORAL | Status: AC
  Filled 2024-10-07: qty 2

## 2024-10-07 MED ORDER — 0.9 % SODIUM CHLORIDE (POUR BTL) OPTIME
TOPICAL | Status: DC | PRN
  Administered 2024-10-07: 1000 mL

## 2024-10-07 MED ORDER — ORAL CARE MOUTH RINSE: 15.0000 mL | Freq: Once | OROMUCOSAL | Status: AC

## 2024-10-07 MED ORDER — ONDANSETRON HCL 4 MG/2ML IJ SOLN
INTRAMUSCULAR | Status: AC
  Filled 2024-10-07: qty 2

## 2024-10-07 MED ORDER — DEXAMETHASONE SOD PHOSPHATE PF 10 MG/ML IJ SOLN
10.0000 mg | Freq: Once | INTRAMUSCULAR | Status: AC
  Administered 2024-10-08: 10 mg via INTRAVENOUS
  Filled 2024-10-07: qty 1

## 2024-10-07 MED ORDER — FENTANYL CITRATE (PF) 100 MCG/2ML IJ SOLN: 25.0000 ug | INTRAMUSCULAR | Status: DC | PRN

## 2024-10-07 MED ORDER — ACETAMINOPHEN 325 MG PO TABS: 325.0000 mg | ORAL_TABLET | Freq: Four times a day (QID) | ORAL | Status: DC | PRN

## 2024-10-07 SURGICAL SUPPLY — 48 items
BAG COUNTER SPONGE SURGICOUNT (BAG) ×1 IMPLANT
BAG DECANTER FOR FLEXI CONT (MISCELLANEOUS) ×1 IMPLANT
BLADE SAG 18X100X1.27 (BLADE) ×1 IMPLANT
COVER PERINEAL POST (MISCELLANEOUS) ×1 IMPLANT
COVER SURGICAL LIGHT HANDLE (MISCELLANEOUS) ×1 IMPLANT
CUP SECTOR GRIPTON 50MM (Cup) IMPLANT
DERMABOND ADVANCED .7 DNX12 (GAUZE/BANDAGES/DRESSINGS) IMPLANT
DRAPE C-ARM 42X72 X-RAY (DRAPES) ×1 IMPLANT
DRAPE POUCH INSTRU U-SHP 10X18 (DRAPES) ×1 IMPLANT
DRAPE STERI IOBAN 125X83 (DRAPES) ×1 IMPLANT
DRAPE U-SHAPE 47X51 STRL (DRAPES) ×2 IMPLANT
DRSG AQUACEL AG ADV 3.5X10 (GAUZE/BANDAGES/DRESSINGS) ×1 IMPLANT
DURAPREP 26ML APPLICATOR (WOUND CARE) ×2 IMPLANT
ELECTRODE BLDE 4.0 EZ CLN MEGD (MISCELLANEOUS) ×1 IMPLANT
ELECTRODE REM PT RTRN 9FT ADLT (ELECTROSURGICAL) ×1 IMPLANT
GLOVE BIOGEL PI IND STRL 7.0 (GLOVE) ×2 IMPLANT
GLOVE BIOGEL PI IND STRL 7.5 (GLOVE) ×1 IMPLANT
GLOVE BIOGEL PI MICRO STRL 7 (GLOVE) ×5 IMPLANT
GLOVE INDICATOR 7.0 STRL GRN (GLOVE) ×1 IMPLANT
GLOVE SURG SYN 7.5 PF PI (GLOVE) ×5 IMPLANT
GOWN STRL REUS W/ TWL LRG LVL3 (GOWN DISPOSABLE) IMPLANT
GOWN STRL REUS W/ TWL XL LVL3 (GOWN DISPOSABLE) ×1 IMPLANT
GOWN STRL SURGICAL XL XLNG (GOWN DISPOSABLE) ×1 IMPLANT
GOWN TOGA ZIPPER T7+ PEEL AWAY (MISCELLANEOUS) ×1 IMPLANT
HEAD FEMORL ARTICU/EZE 022.225 (Head) IMPLANT
HOOD PEEL AWAY T7 (MISCELLANEOUS) ×1 IMPLANT
IV 0.9% NACL 1000 ML (IV SOLUTION) ×1 IMPLANT
KIT BASIN OR (CUSTOM PROCEDURE TRAY) ×1 IMPLANT
LINER BM HIP ALTRX 43/22 (Liner) IMPLANT
LINER DM PINNACLE 50/43 (Liner) IMPLANT
MARKER SKIN DUAL TIP RULER LAB (MISCELLANEOUS) ×1 IMPLANT
NEEDLE SPNL 18GX3.5 QUINCKE PK (NEEDLE) ×1 IMPLANT
PACK TOTAL JOINT (CUSTOM PROCEDURE TRAY) ×1 IMPLANT
PACK UNIVERSAL I (CUSTOM PROCEDURE TRAY) ×1 IMPLANT
SCREW 6.5MMX25MM (Screw) IMPLANT
SET HNDPC FAN SPRY TIP SCT (DISPOSABLE) ×1 IMPLANT
SOLUTION PRONTOSAN WOUND 350ML (IRRIGATION / IRRIGATOR) ×1 IMPLANT
STEM FEM ACTIS HIGH SZ3 (Stem) IMPLANT
SUT ETHIBOND 2 V 37 (SUTURE) ×1 IMPLANT
SUT STRATAFIX PDS+ 0 24IN (SUTURE) IMPLANT
SUT VIC AB 0 CT1 27XBRD ANBCTR (SUTURE) ×1 IMPLANT
SUT VIC AB 1 CTX36XBRD ANBCTR (SUTURE) ×1 IMPLANT
SUT VIC AB 2-0 CT1 TAPERPNT 27 (SUTURE) ×2 IMPLANT
SYR 30ML LL (SYRINGE) ×2 IMPLANT
TOWEL GREEN STERILE (TOWEL DISPOSABLE) ×1 IMPLANT
TRAY FOLEY W/BAG SLVR 16FR ST (SET/KITS/TRAYS/PACK) IMPLANT
TUBE SUCT ARGYLE STRL (TUBING) ×1 IMPLANT
YANKAUER SUCT BULB TIP NO VENT (SUCTIONS) ×1 IMPLANT

## 2024-10-07 NOTE — Evaluation (Signed)
 Physical Therapy Evaluation Patient Details Name: Melissa Brennan MRN: [id number] DOB: Oct 15, 1945 Today's Date: 10/07/2024  History of Present Illness  79 y.o. female presents to [facility] hospital on 10/07/2024 for elective R THA. PMH includes chronic GIB.  Clinical Impression  Pt presents to PT with deficits in functional mobility, gait, balance, strength, ROM. Pt is able to ambulate for household distances with support of the RW. PT provides education on the THA exercise packet and encourages frequent mobilization with staff assistance. PT will follow up tomorrow for a progression of gait and to initiate stair training.         If plan is discharge home, recommend the following: A little help with walking and/or transfers;A little help with bathing/dressing/bathroom;Assistance with cooking/housework;Assist for transportation;Help with stairs or ramp for entrance   Can travel by private vehicle        Equipment Recommendations None recommended by PT  Recommendations for Other Services       Functional Status Assessment Patient has had a recent decline in their functional status and demonstrates the ability to make significant improvements in function in a reasonable and predictable amount of time.     Precautions / Restrictions Precautions Precautions: Fall Recall of Precautions/Restrictions: Intact Precaution/Restrictions Comments: direct anterior THA, no precautions Restrictions Weight Bearing Restrictions Per Provider Order: Yes RLE Weight Bearing Per Provider Order: Weight bearing as tolerated      Mobility  Bed Mobility Overal bed mobility: Needs Assistance Bed Mobility: Supine to Sit, Sit to Supine     Supine to sit: Supervision Sit to supine: Supervision        Transfers Overall transfer level: Needs assistance Equipment used: Rolling walker (2 wheels) Transfers: Sit to/from Stand Sit to Stand: Supervision                Ambulation/Gait Ambulation/Gait  assistance: Supervision Gait Distance (Feet): 100 Feet Assistive device: Rolling walker (2 wheels) Gait Pattern/deviations: Step-through pattern Gait velocity: reduced Gait velocity interpretation: <1.8 ft/sec, indicate of risk for recurrent falls   General Gait Details: slowed step-through gait  Stairs            Wheelchair Mobility     Tilt Bed    Modified Rankin (Stroke Patients Only)       Balance Overall balance assessment: Needs assistance Sitting-balance support: No upper extremity supported, Feet supported Sitting balance-Leahy Scale: Good     Standing balance support: Bilateral upper extremity supported, Reliant on assistive device for balance Standing balance-Leahy Scale: Poor                               Pertinent Vitals/Pain Pain Assessment Pain Assessment: 0-10 Pain Score: 4  Pain Location: R hip Pain Descriptors / Indicators: Sore Pain Intervention(s): Monitored during session    Home Living Family/patient expects to be discharged to:: Private residence Living Arrangements: Alone Available Help at Discharge: Family;Available 24 hours/day Type of Home: House Home Access: Stairs to enter Entrance Stairs-Rails: None Entrance Stairs-Number of Steps: 1 Alternate Level Stairs-Number of Steps: 8 Home Layout: Two level Home Equipment: Agricultural Consultant (2 wheels);Cane - single point      Prior Function Prior Level of Function : Independent/Modified Independent;Driving             Mobility Comments: ambulatory without DME       Extremity/Trunk Assessment   Upper Extremity Assessment Upper Extremity Assessment: Overall WFL for tasks assessed    Lower Extremity Assessment  Lower Extremity Assessment: RLE deficits/detail RLE Deficits / Details: generalized post-op weakness as anticipated on POD 0    Cervical / Trunk Assessment Cervical / Trunk Assessment: Normal  Communication   Communication Communication: No apparent  difficulties    Cognition Arousal: Alert Behavior During Therapy: WFL for tasks assessed/performed   PT - Cognitive impairments: No apparent impairments                         Following commands: Intact       Cueing Cueing Techniques: Verbal cues     General Comments General comments (skin integrity, edema, etc.): VSS on RA    Exercises Other Exercises Other Exercises: PT provides education regarding the surgical hip HEP packet   Assessment/Plan    PT Assessment Patient needs continued PT services  PT Problem List Decreased strength;Decreased activity tolerance;Decreased balance;Decreased mobility;Pain       PT Treatment Interventions DME instruction;Gait training;Stair training;Functional mobility training;Therapeutic activities;Therapeutic exercise;Balance training;Neuromuscular re-education;Patient/family education    PT Goals (Current goals can be found in the Care Plan section)  Acute Rehab PT Goals Patient Stated Goal: to return to independence PT Goal Formulation: With patient/family Time For Goal Achievement: 10/11/24 Potential to Achieve Goals: Good    Frequency 7X/week     Co-evaluation               AM-PAC PT 6 Clicks Mobility  Outcome Measure Help needed turning from your back to your side while in a flat bed without using bedrails?: None Help needed moving from lying on your back to sitting on the side of a flat bed without using bedrails?: A Little Help needed moving to and from a bed to a chair (including a wheelchair)?: A Little Help needed standing up from a chair using your arms (e.g., wheelchair or bedside chair)?: A Little Help needed to walk in hospital room?: A Little Help needed climbing 3-5 steps with a railing? : A Little 6 Click Score: 19    End of Session Equipment Utilized During Treatment: Gait belt Activity Tolerance: Patient tolerated treatment well Patient left: in bed;with call bell/phone within reach;with  bed alarm set Nurse Communication: Mobility status PT Visit Diagnosis: Other abnormalities of gait and mobility (R26.89);Muscle weakness (generalized) (M62.81);Pain Pain - Right/Left: Right Pain - part of body: Hip    Time: 8457-8391 PT Time Calculation (min) (ACUTE ONLY): 26 min   Charges:   PT Evaluation $PT Eval Low Complexity: 1 Low   PT General Charges $$ ACUTE PT VISIT: 1 Visit         Bernardino JINNY Ruth, PT, DPT Acute Rehabilitation Office [phone]   Bernardino JINNY Ruth 10/07/2024, 4:19 PM

## 2024-10-07 NOTE — Anesthesia Procedure Notes (Addendum)
 Spinal  Patient location during procedure: OR Start time: 10/07/2024 10:25 AM End time: 10/07/2024 10:28 AM Reason for block: surgical anesthesia  Staffing Performed: anesthesiologist  Authorized by: Lucious Debby BRAVO, MD   Performed by: Lucious Debby BRAVO, MD  Preanesthetic Checklist Completed: patient identified, IV checked, risks and benefits discussed, surgical consent, monitors and equipment checked, pre-op evaluation and timeout performed Spinal Block Patient position: sitting Prep: DuraPrep Patient monitoring: heart rate, cardiac monitor, continuous pulse ox and blood pressure Approach: midline Location: L3-4 Injection technique: single-shot Needle Needle type: Pencan  Needle gauge: 24 G  Additional Notes Consent was obtained prior to the procedure with all questions answered and concerns addressed. Risks including, but not limited to, bleeding, infection, nerve damage, paralysis, failed block, inadequate analgesia, allergic reaction, high spinal, itching, and headache were discussed and the patient wished to proceed. Functioning IV was confirmed and monitors were applied. Sterile prep and drape, including hand hygiene, mask, and sterile gloves were used. The patient was positioned and the spine was prepped. The skin was anesthetized with lidocaine . Free flow of clear CSF was obtained prior to injecting local anesthetic into the CSF. The spinal needle aspirated freely following injection. The needle was carefully withdrawn. The patient tolerated the procedure well.   Debby Lucious, MD

## 2024-10-07 NOTE — Discharge Instructions (Signed)

## 2024-10-07 NOTE — Anesthesia Postprocedure Evaluation (Signed)
"   Anesthesia Post Note  Patient: Melissa Brennan  Procedure(s) Performed: ARTHROPLASTY, HIP, TOTAL, ANTERIOR APPROACH, RIGHT (Right: Hip)     Patient location during evaluation: PACU Anesthesia Type: Spinal Level of consciousness: awake and alert Pain management: pain level controlled Vital Signs Assessment: post-procedure vital signs reviewed and stable Respiratory status: spontaneous breathing and respiratory function stable Cardiovascular status: blood pressure returned to baseline and stable Postop Assessment: spinal receding and no apparent nausea or vomiting Anesthetic complications: no   There were no known notable events for this encounter.  Last Vitals:  Vitals:   10/07/24 1345 10/07/24 1400  BP: (!) 153/81 (!) 154/82  Pulse: 62 63  Resp: 12 14  Temp:  36.6 C  SpO2: 100% 100%    Last Pain:  Vitals:   10/07/24 1400  PainSc: 0-No pain                 Debby FORBES Like      "

## 2024-10-07 NOTE — Op Note (Signed)
 "  ARTHROPLASTY, HIP, TOTAL, ANTERIOR APPROACH, RIGHT  Procedure Note Melissa Brennan   [id number]  Pre-op Diagnosis: osteoarthritis of right hip     Post-op Diagnosis: same  Operative Findings Protrusio Successful correction of leg length discrepancy   Operative Procedures  1. Total hip replacement; Right hip; uncemented [id number]   Surgeon: Kay Cummins, M.D.  Assist: Ronal Morna Grave, PA-C   Anesthesia: spinal  Prosthesis: Depuy Acetabulum: Pinnacle 50 mm Femur: Actis 3 HO Head: 43/22 dual mobility size: +4 Liner: +4 neutral Bearing Type: dual mobility  Total Hip Arthroplasty (Anterior Approach) Op Note:  After informed consent was obtained and the operative extremity marked in the holding area, the patient was brought back to the operating room and placed supine on the HANA table. Next, the operative extremity was prepped and draped in normal sterile fashion. Surgical timeout occurred verifying patient identification, surgical site, surgical procedure and administration of antibiotics.  A 10 cm longitudinal incision was made starting from 2 fingerbreadths lateral and inferior to the ASIS towards the lateral aspect of the patella.  A Hueter approach to the hip was performed, using the interval between tensor fascia lata and sartorius.  Dissection was carried bluntly down onto the anterior hip capsule. The lateral femoral circumflex vessels were identified and coagulated. A capsulotomy was performed and the capsular flaps tagged for later repair.  The neck osteotomy was performed 1 fingerbreadth above the lesser trochanter. The femoral head was removed which showed severe degeneration and complete loss of articular cartilage, the acetabular rim was cleared of soft tissue and osteophytes and attention was turned to reaming the acetabulum.  Sequential reaming was carefully performed using fluoroscopic guidance.  I made sure not to ream medial because of protrusio.  The reamer was  positioned where I felt her hip center of rotation was supposed to be.  I was able to get achieve excellent bite and cancellous bone circumferentially with a 49 mm reamer.  I then used a 43 mm reamer to gently reamer the medial bone to promote bony ingrowth in Zone 3 of the acetabulum.  I used the reamings as bone graft and this was packed into the acetabulum prior to impacting the acetabular component.  The cup had excellent fixation.  To augment the fixation, a 25 mm cancellous screw was placed.  A +4 neutral liner was then placed after irrigation and attention turned to the femur.  After placing the femoral hook, the leg was taken to externally rotated, extended and adducted position taking care to perform soft tissue releases to allow for adequate mobilization of the femur. Soft tissue was cleared from the shoulder of the greater trochanter and the hook elevator used to improve exposure of the proximal femur.  Lateral bone from the shoulder was rasped away for relief.  Sequential broaching performed up to a size 3.  Standard trial neck and +4 dual mobility head were placed. 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  The leg was brought back up to neutral and the construct reduced.  The position and sizing of components, offset and leg lengths were checked using fluoroscopy.  Based on fluoroscopic findings, we chose to retrial with high offset neck and +4 dual mobility head ball.  Stability of the construct was checked in 45 degrees of hip extension and 90 degrees of external rotation without any subluxation, shuck or impingement of prosthesis. We dislocated the prosthesis, dropped the leg back into position, removed trial components, and irrigated  copiously. The final stem and head were chosen then placed, the leg brought back up, the system reduced and fluoroscopy used to verify positioning.  Antibiotic irrigation was placed in the surgical wound.   We irrigated, obtained hemostasis  and closed the capsule using #2 ethibond suture.  A topical mixture of 0.25% bupivacaine  and meloxicam  was placed deep to the fascia.  One gram of vancomycin  powder was placed in the surgical bed.   One gram of topical tranexamic acid  was injected into the joint.  The fascia was closed with #1 stratafix, the deep fat layer was closed with 0 vicryl, the subcutaneous layers closed with 2.0 Vicryl Plus and the skin closed with 2.0 nylon and dermabond. A sterile dressing was applied. The patient was awakened in the operating room and taken to recovery in stable condition.  All sponge, needle, and instrument counts were correct at the end of the case.   Morna Grave, my PA, was a medical necessity for opening, closing, limb positioning, retracting, exposing, and overall facilitation and timely completion of the surgery.  Position: supine  Complications: see description of procedure.  Time Out: performed   Drains/Packing: none  Estimated blood loss: see anesthesia record  Returned to Recovery Room: in good condition.   Antibiotics: yes   Mechanical VTE (DVT) Prophylaxis: sequential compression devices, TED thigh-high  Chemical VTE (DVT) Prophylaxis: aspirin POD 0   Fluid Replacement: see anesthesia record  Specimens Removed: 1 to pathology   Sponge and Instrument Count Correct? yes   PACU: portable radiograph - low AP   Plan/RTC: Return in 2 weeks for suture removal. Weight Bearing/Load Lower Extremity: full  Hip precautions: none Suture Removal: 2 weeks   N. Ozell Cummins, MD Melissa Brennan 1:28 PM   Implant Name Type Inv. Item Serial No. Manufacturer Lot No. LRB No. Used Action  LINER DM PINNACLE 50/43 - [id number] Liner LINER DM PINNACLE 50/43  [employer] [id number] Right 1 Implanted  CUP SECTOR GRIPTON - [id number] Cup CUP SECTOR GRIPTON  [employer] [id number] Right 1 Implanted  SCREW 6.5MMX25MM - [id number] Screw SCREW 6.5MMX25MM  [employer]  [id number] Right 1 Implanted  LINER BM HIP ALTRX 43/22 - [id number] Liner LINER BM HIP ALTRX 43/22  [employer] [id number] Right 1 Implanted  STEM FEM ACTIS HIGH SZ3 - [id number] Stem STEM FEM ACTIS HIGH SZ3  [employer] [id number] Right 1 Implanted  HEAD FEMORL ARTICU/EZE 022.225 - [id number] Head HEAD FEMORL ARTICU/EZE 022.225  [employer] [id number] Right 1 Implanted   "

## 2024-10-07 NOTE — Transfer of Care (Signed)
 Immediate Anesthesia Transfer of Care Note  Patient: Melissa Brennan  Procedure(s) Performed: ARTHROPLASTY, HIP, TOTAL, ANTERIOR APPROACH, RIGHT (Right: Hip)  Patient Location: PACU  Anesthesia Type:MAC and Spinal  Level of Consciousness: awake, alert , and oriented  Airway & Oxygen Therapy: Patient Spontanous Breathing  Post-op Assessment: Report given to RN and Post -op Vital signs reviewed and stable  Post vital signs: Reviewed and stable  Last Vitals:  Vitals Value Taken Time  BP 128/73 10/07/24 12:33  Temp    Pulse 62 10/07/24 12:36  Resp 10 10/07/24 12:36  SpO2 100 % 10/07/24 12:36  Vitals shown include unfiled device data.  Last Pain:  Vitals:   10/07/24 0811  PainSc: 0-No pain         Complications: There were no known notable events for this encounter.

## 2024-10-07 NOTE — H&P (Signed)
 "   PREOPERATIVE H&P  Chief Complaint: osteoarthritis of right hip  HPI: Melissa Brennan is a 79 y.o. female who presents for surgical treatment of osteoarthritis of right hip.  She denies any changes in medical history.  Past Surgical History:  Procedure Laterality Date   BUNIONECTOMY     april 2023 was the 2nd surgery   ENTEROSCOPY N/A 02/12/2021   Procedure: ENTEROSCOPY;  Surgeon: Rollin Dover, MD;  Location: [facility];  Service: Endoscopy;  Laterality: N/A;   ESOPHAGOGASTRODUODENOSCOPY (EGD) WITH PROPOFOL  N/A 07/26/2019   Procedure: ESOPHAGOGASTRODUODENOSCOPY (EGD) WITH PROPOFOL ;  Surgeon: Rollin Dover, MD;  Location: [facility];  Service: Endoscopy;  Laterality: N/A;   HOT HEMOSTASIS N/A 07/26/2019   Procedure: HOT HEMOSTASIS (ARGON PLASMA COAGULATION/BICAP);  Surgeon: Rollin Dover, MD;  Location: [facility];  Service: Endoscopy;  Laterality: N/A;   Social History   Socioeconomic History   Marital status: Married    Spouse name: Not on file   Number of children: Not on file   Years of education: Not on file   Highest education level: Not on file  Occupational History   Not on file  Tobacco Use   Smoking status: Never   Smokeless tobacco: Never  Vaping Use   Vaping status: Never Used  Substance and Sexual Activity   Alcohol use: No   Drug use: No   Sexual activity: Not on file  Other Topics Concern   Not on file  Social History Narrative   Not on file   Social Drivers of Health   Tobacco Use: Low Risk (10/07/2024)   Patient History    Smoking Tobacco Use: Never    Smokeless Tobacco Use: Never    Passive Exposure: Not on file  Financial Resource Strain: Not on file  Food Insecurity: Low Risk (04/29/2024)   Received from [facility]   [facility] Food Security    Within the past 12 months, the food you bought just didn't last and you didn't have money to get more.: 3    Within the past 12 months, you worried that your food would run out before you got money to  buy more.: 3  Transportation Needs: Not At Risk (05/10/2024)   Received from [facility]   [facility] Transportation Needs    In the past 12 months, has lack of reliable transportation kept you from medical appointments, meetings, work or from getting things needed for daily living?: No  Physical Activity: Not on file  Stress: Not on file  Social Connections: Not on file  Depression (EYV7-0): Not on file  Alcohol Screen: Not on file  Housing: Not on file  Utilities: Not At Risk (04/29/2024)   Received from [facility]   [facility] Utilities    In the past 12 months has the electric, gas, oil, or water  company threatened to shut off services in your home?: No  Health Literacy: Low Risk (05/10/2024)   Received from [facility]   [facility] Literacy    How often do you need to have someone help you when you read instructions, pamphlets, or other written material from your doctor or pharmacy?: Never   History reviewed. No pertinent family history. Allergies[1] Prior to Admission medications  Medication Sig Start Date End Date Taking? Authorizing Provider  acetaminophen  (TYLENOL ) 325 MG tablet Take 650 mg by mouth every 6 (six) hours as needed for moderate pain (pain score 4-6).   Yes [provider]  amiodarone  (PACERONE ) 200 MG tablet Take 100 mg by mouth daily. 03/10/21  Yes  [provider]  apixaban  (ELIQUIS ) 5 MG TABS tablet Take 5 mg by mouth 2 (two) times daily.   Yes [provider]  docusate sodium  (COLACE) 100 MG capsule Take 1 capsule (100 mg total) by mouth daily as needed. Patient not taking: Reported on 10/02/2024 09/25/24 09/25/25  Jule Ronal CROME, PA-C  losartan  (COZAAR ) 25 MG tablet Take 25 mg by mouth daily.   Yes [provider]  methocarbamol  (ROBAXIN ) 500 MG tablet Take 1 tablet (500 mg total) by mouth 2 (two) times daily as needed. 09/25/24   Jule Ronal CROME, PA-C  omeprazole (PRILOSEC) 20 MG capsule Take 20 mg by mouth daily before breakfast.   Yes  [provider]  ondansetron  (ZOFRAN ) 4 MG tablet Take 1 tablet (4 mg total) by mouth every 8 (eight) hours as needed for nausea or vomiting. 09/25/24   Jule Ronal CROME, PA-C  oxyCODONE -acetaminophen  (PERCOCET) 5-325 MG tablet Take 1 tablet by mouth every 8 (eight) hours as needed. To be taken after surgery 09/25/24   Jule Ronal CROME, PA-C  polyethylene glycol (MIRALAX  / GLYCOLAX ) 17 g packet Take 17 g by mouth daily as needed for moderate constipation.   Yes [provider]  rosuvastatin  (CRESTOR ) 20 MG tablet Take 10 mg by mouth daily. 12/19/20  Yes [provider]  diclofenac  Sodium (VOLTAREN ) 1 % GEL Apply 4 g topically 4 (four) times daily. Patient not taking: Reported on 10/02/2024 09/27/23   Emil Share, DO  methocarbamol  (ROBAXIN ) 500 MG tablet Take 1 tablet (500 mg total) by mouth 2 (two) times daily. Patient not taking: Reported on 10/02/2024 09/27/23   Emil Share, DO     Positive ROS: All other systems have been reviewed and were otherwise negative with the exception of those mentioned in the HPI and as above.  Physical Exam: General: Alert, no acute distress Cardiovascular: No pedal edema Respiratory: No cyanosis, no use of accessory musculature GI: abdomen soft Skin: No lesions in the area of chief complaint Neurologic: Sensation intact distally Psychiatric: Patient is competent for consent with normal mood and affect Lymphatic: no lymphedema  MUSCULOSKELETAL: exam stable  Assessment: osteoarthritis of right hip  Plan: Plan for Procedures: ARTHROPLASTY, HIP, TOTAL, ANTERIOR APPROACH  The risks benefits and alternatives were discussed with the patient including but not limited to the risks of nonoperative treatment, versus surgical intervention including infection, bleeding, nerve injury,  blood clots, cardiopulmonary complications, morbidity, mortality, among others, and they were willing to proceed.   Ozell Cummins, MD 10/07/2024 8:57 AM     [1]   Allergies Allergen Reactions   Medrol  [Methylprednisolone ] Rash   "

## 2024-10-08 ENCOUNTER — Other Ambulatory Visit: Payer: Self-pay | Admitting: Physician Assistant

## 2024-10-08 ENCOUNTER — Encounter (HOSPITAL_COMMUNITY): Payer: Self-pay | Admitting: Orthopaedic Surgery

## 2024-10-08 ENCOUNTER — Telehealth: Payer: Self-pay | Admitting: Orthopaedic Surgery

## 2024-10-08 DIAGNOSIS — M1611 Unilateral primary osteoarthritis, right hip: Secondary | ICD-10-CM | POA: Diagnosis not present

## 2024-10-08 MED ORDER — HYDROCODONE-ACETAMINOPHEN 5-325 MG PO TABS: 1.0000 | ORAL_TABLET | Freq: Three times a day (TID) | ORAL | 0 refills | Status: AC | PRN

## 2024-10-08 NOTE — Telephone Encounter (Signed)
 Did you take care of her concerns when you rounded? Not sure if I need to call her or not?

## 2024-10-08 NOTE — TOC Transition Note (Addendum)
 Transition of Care [facility]) - Discharge Note   Patient Details  Name: Melissa Brennan MRN: [id number] Date of Birth: 1946/07/28  [facility] CM/SW Contact:  Marval Gell, RN Phone Number: 10/08/2024, 9:25 AM   Clinical Narrative:     Spoke w patient and visitor at bedside.  She would like [facility] services, she was very pleased with them from prior use. Referral made to liaison and accepted.  She states that she has multiple walkers at home and her bathroom set up would not fit shower seat/ 3/1. She states that she is very comfortable using her walker in the bathroom and the shower.  No further ICM needs identified.  Daughter to transport home  Requested CMA to schedule PCP appointment     Barriers to Discharge: No Barriers Identified   Patient Goals and CMS Choice Patient states their goals for this hospitalization and ongoing recovery are:: to return home CMS Medicare.gov Compare Post Acute Care list provided to:: Patient Choice offered to / list presented to : Patient      Discharge Placement                       Discharge Plan and Services Additional resources added to the After Visit Summary for     Discharge Planning Services: CM Consult Post Acute Care Choice: Home Health          DME Arranged: N/A (patient declined additional DME)         HH Arranged: PT HH Agency: [facility] Date HH Agency Contacted: 10/08/24 Time HH Agency Contacted: [phone] Representative spoke with at [facility] Agency: [facility]  Social Drivers of Health (SDOH) Interventions SDOH Screenings   Food Insecurity: No Food Insecurity (10/07/2024)  Housing: Low Risk (10/07/2024)  Transportation Needs: No Transportation Needs (10/07/2024)  Utilities: Not At Risk (10/07/2024)  Social Connections: Moderately Isolated (10/07/2024)  Tobacco Use: Low Risk (10/07/2024)  Health Literacy: Low Risk (05/10/2024)   Received from [facility]     Readmission Risk  Interventions     No data to display

## 2024-10-08 NOTE — Progress Notes (Signed)
 Physical Therapy Treatment Patient Details Name: Melissa Brennan MRN: [id number] DOB: [birth year] Today's Date: 10/08/2024   History of Present Illness 79 y.o. female presents to [facility] hospital on 10/07/2024 for elective R THA. PMH includes chronic GIB.    PT Comments  Pt tolerates treatment well, ambulating for increased distances and negotiating stairs without physical assistance. PT provides education on management of ICEMAN machine to aide in post-surgical edema management. Pt is mobilizing well and is able to perform all mobility required in the home setting. PT recommends discharge home when medically appropriate.    If plan is discharge home, recommend the following: A little help with bathing/dressing/bathroom;Assistance with cooking/housework;Assist for transportation;Help with stairs or ramp for entrance   Can travel by private vehicle        Equipment Recommendations  None recommended by PT    Recommendations for Other Services       Precautions / Restrictions Precautions Precautions: Fall Recall of Precautions/Restrictions: Intact Precaution/Restrictions Comments: direct anterior THA, no precautions Restrictions Weight Bearing Restrictions Per Provider Order: Yes RLE Weight Bearing Per Provider Order: Weight bearing as tolerated     Mobility  Bed Mobility Overal bed mobility: Modified Independent Bed Mobility: Supine to Sit     Supine to sit: Modified independent (Device/Increase time)          Transfers Overall transfer level: Needs assistance Equipment used: Rolling walker (2 wheels) Transfers: Sit to/from Stand Sit to Stand: Supervision                Ambulation/Gait Ambulation/Gait assistance: Supervision Gait Distance (Feet): 200 Feet Assistive device: Rolling walker (2 wheels) Gait Pattern/deviations: Step-through pattern Gait velocity: reduced Gait velocity interpretation: <1.8 ft/sec, indicate of risk for recurrent falls   General Gait  Details: steady step-through gait   Stairs Stairs: Yes Stairs assistance: Supervision Stair Management: One rail Right, Step to pattern, Forwards (pt descends the steps sideways with support of one railing) Number of Stairs: 9 (limited by IV length)     Wheelchair Mobility     Tilt Bed    Modified Rankin (Stroke Patients Only)       Balance Overall balance assessment: Needs assistance Sitting-balance support: No upper extremity supported, Feet supported Sitting balance-Leahy Scale: Good     Standing balance support: Single extremity supported, Reliant on assistive device for balance Standing balance-Leahy Scale: Poor                              Communication Communication Communication: No apparent difficulties  Cognition Arousal: Alert Behavior During Therapy: WFL for tasks assessed/performed   PT - Cognitive impairments: No apparent impairments                         Following commands: Intact      Cueing Cueing Techniques: Verbal cues  Exercises      General Comments General comments (skin integrity, edema, etc.): VSS on RA      Pertinent Vitals/Pain Pain Assessment Pain Assessment: 0-10 Pain Score: 5  Pain Location: R hip Pain Descriptors / Indicators: Sore Pain Intervention(s): Monitored during session    Home Living                          Prior Function            PT Goals (current goals can now be found in the care plan section)  Acute Rehab PT Goals Patient Stated Goal: to return to independence Progress towards PT goals: Progressing toward goals    Frequency    7X/week      PT Plan      Co-evaluation              AM-PAC PT 6 Clicks Mobility   Outcome Measure  Help needed turning from your back to your side while in a flat bed without using bedrails?: None Help needed moving from lying on your back to sitting on the side of a flat bed without using bedrails?: None Help needed  moving to and from a bed to a chair (including a wheelchair)?: A Little Help needed standing up from a chair using your arms (e.g., wheelchair or bedside chair)?: A Little Help needed to walk in hospital room?: A Little Help needed climbing 3-5 steps with a railing? : A Little 6 Click Score: 20    End of Session Equipment Utilized During Treatment: Gait belt Activity Tolerance: Patient tolerated treatment well Patient left: in chair;with call bell/phone within reach;with family/visitor present Nurse Communication: Mobility status PT Visit Diagnosis: Other abnormalities of gait and mobility (R26.89);Muscle weakness (generalized) (M62.81);Pain Pain - Right/Left: Right Pain - part of body: Hip     Time: 9092-9065 PT Time Calculation (min) (ACUTE ONLY): 27 min  Charges:    $Gait Training: 23-37 mins PT General Charges $$ ACUTE PT VISIT: 1 Visit                     Bernardino JINNY Ruth, PT, DPT Acute Rehabilitation Office [phone]    Bernardino JINNY Ruth 10/08/2024, 11:27 AM

## 2024-10-08 NOTE — Telephone Encounter (Signed)
 Called patient. All questions were answered earlier. She was advised to continue wearing compression socks until her post op appointment. She can remove them at night. Keep incision dry. Taking methocarbamol  and tramadol. She will call if she has any further questions or concerns.

## 2024-10-08 NOTE — Discharge Summary (Signed)
 Patient ID: Melissa Brennan MRN: [id number] DOB/AGE: 1946-07-27 79 y.o.  Admit date: 10/07/2024 Discharge date: 10/08/2024  Admission Diagnoses:  Principal Problem:   Primary osteoarthritis of right hip Active Problems:   Status post total replacement of right hip   Discharge Diagnoses:  Same  Past Medical History:  Diagnosis Date   A-fib (HCC)    Hyperlipidemia    Hypertension    Vertigo     Surgeries: Procedures: ARTHROPLASTY, HIP, TOTAL, ANTERIOR APPROACH, RIGHT on 10/07/2024   Consultants:   Discharged Condition: Improved  Hospital Course: Melissa Brennan is an 79 y.o. female who was admitted 10/07/2024 for operative treatment ofPrimary osteoarthritis of right hip. Patient has severe unremitting pain that affects sleep, daily activities, and work/hobbies. After pre-op clearance the patient was taken to the operating room on 10/07/2024 and underwent  Procedures: ARTHROPLASTY, HIP, TOTAL, ANTERIOR APPROACH, RIGHT.    Patient was given perioperative antibiotics:  Anti-infectives (From admission, onward)    Start     Dose/Rate Route Frequency Ordered Stop   10/07/24 1111  vancomycin  (VANCOCIN ) powder  Status:  Discontinued          As needed 10/07/24 1112 10/07/24 1229   10/07/24 0800  ceFAZolin  (ANCEF ) IVPB 2g/100 mL premix        2 g 200 mL/hr over 30 Minutes Intravenous On call to O.R. 10/07/24 0745 10/07/24 1036        Patient was given sequential compression devices, early ambulation, and chemoprophylaxis to prevent DVT.  Inpatient Morphine  Milligram Equivalents Per Day 1/12 - 1/13   Values displayed are in units of MME/Day    Order Start / End Date Yesterday Today    oxyCODONE  (Oxy IR/ROXICODONE ) immediate release tablet 5 mg 1/12 - 1/12 0 of Unknown --    oxyCODONE  (ROXICODONE ) 5 MG/5ML solution 5 mg 1/12 - 1/12 0 of Unknown --      Group total: 0 of Unknown     fentaNYL  (SUBLIMAZE ) injection 25-50 mcg 1/12 - 1/12 0 of 45-90 --    HYDROcodone -acetaminophen   (NORCO/VICODIN) 5-325 MG per tablet 1-2 tablet 1/12 - No end date 5 of 10-20 0 of 15-30    HYDROcodone -acetaminophen  (NORCO) 7.5-325 MG per tablet 1-2 tablet 1/12 - No end date 0 of 15-30 7.5 of 22.5-45    morphine  (PF) 2 MG/ML injection 0.5-1 mg 1/12 - No end date 0 of 3-6 0 of 6-12    Daily Totals  5 of Unknown (at least 73-146) 7.5 of 43.5-87    Calculation Errors     Order Type Date Details   oxyCODONE  (Oxy IR/ROXICODONE ) immediate release tablet 5 mg Ordered Dose -- Insufficient frequency information   oxyCODONE  (ROXICODONE ) 5 MG/5ML solution 5 mg Ordered Dose -- Insufficient frequency information            Patient benefited maximally from hospital stay and there were no complications.    Recent vital signs: Patient Vitals for the past 24 hrs:  BP Temp Temp src Pulse Resp SpO2 Height Weight  10/08/24 0751 129/65 (!) 97.3 F (36.3 C) Oral 68 -- 100 % -- --  10/08/24 0552 126/72 98 F (36.7 C) Oral 73 17 98 % -- --  10/08/24 0239 127/65 98 F (36.7 C) Oral 71 -- 97 % -- --  10/07/24 1853 136/82 (!) 97.4 F (36.3 C) Axillary 76 16 99 % -- --  10/07/24 1506 -- -- -- -- -- -- 5' 5 (1.651 m) 51.3 kg  10/07/24 1438 137/72 97.9 F (36.6  C) Oral 67 16 100 % 5' 5 (1.651 m) 51.3 kg  10/07/24 1400 (!) 154/82 97.9 F (36.6 C) -- 63 14 100 % -- --  10/07/24 1345 (!) 153/81 -- -- 62 12 100 % -- --  10/07/24 1330 (!) 140/72 -- -- 62 11 100 % -- --  10/07/24 1315 134/71 -- -- 61 10 100 % -- --  10/07/24 1300 123/69 -- -- 61 12 97 % -- --  10/07/24 1245 123/67 -- -- 60 13 98 % -- --  10/07/24 1234 128/73 97.8 F (36.6 C) -- 60 17 100 % -- --     Recent laboratory studies: No results for input(s): WBC, HGB, HCT, PLT, NA, K, CL, CO2, BUN, CREATININE, GLUCOSE, INR, CALCIUM  in the last 72 hours.  Invalid input(s): PT, 2   Discharge Medications:   Allergies as of 10/08/2024       Reactions   Medrol  [methylprednisolone ] Rash        Medication List      STOP taking these medications    acetaminophen  325 MG tablet Commonly known as: TYLENOL    docusate sodium  100 MG capsule Commonly known as: Colace   oxyCODONE -acetaminophen  5-325 MG tablet Commonly known as: Percocet       TAKE these medications    amiodarone  200 MG tablet Commonly known as: PACERONE  Take 100 mg by mouth daily.   apixaban  5 MG Tabs tablet Commonly known as: ELIQUIS  Take 5 mg by mouth 2 (two) times daily.   diclofenac  Sodium 1 % Gel Commonly known as: VOLTAREN  Apply 4 g topically 4 (four) times daily.   HYDROcodone -acetaminophen  5-325 MG tablet Commonly known as: NORCO/VICODIN Take 1 tablet by mouth 3 (three) times daily as needed.   losartan  25 MG tablet Commonly known as: COZAAR  Take 25 mg by mouth daily.   methocarbamol  500 MG tablet Commonly known as: ROBAXIN  Take 1 tablet (500 mg total) by mouth 2 (two) times daily.   methocarbamol  500 MG tablet Commonly known as: ROBAXIN  Take 1 tablet (500 mg total) by mouth 2 (two) times daily as needed.   omeprazole 20 MG capsule Commonly known as: PRILOSEC Take 20 mg by mouth daily before breakfast.   ondansetron  4 MG tablet Commonly known as: Zofran  Take 1 tablet (4 mg total) by mouth every 8 (eight) hours as needed for nausea or vomiting.   polyethylene glycol 17 g packet Commonly known as: MIRALAX  / GLYCOLAX  Take 17 g by mouth daily as needed for moderate constipation.   rosuvastatin  20 MG tablet Commonly known as: CRESTOR  Take 10 mg by mouth daily.               Durable Medical Equipment  (From admission, onward)           Start     Ordered   10/07/24 1442  DME Walker rolling  Once       Question:  Patient needs a walker to treat with the following condition  Answer:  History of hip replacement   10/07/24 1441   10/07/24 1442  DME 3 n 1  Once        10/07/24 1441   10/07/24 1442  DME Bedside commode  Once       Question:  Patient needs a bedside commode to treat with  the following condition  Answer:  History of hip replacement   10/07/24 1441            Diagnostic Studies: DG Pelvis Portable Result Date:  10/07/2024 CLINICAL DATA:  Postop EXAM: DG PORTABLE PELVIS COMPARISON:  07/23/2024 FINDINGS: SI joints are non widened. Pubic symphysis and rami appear intact. Status post right hip replacement with intact hardware and normal alignment. Gas in the soft tissues consistent with recent surgery IMPRESSION: Status post right hip replacement with expected postsurgical change. Electronically Signed   By: Luke Bun M.D.   On: 10/07/2024 23:37   DG HIP UNILAT WITH PELVIS 1V RIGHT Result Date: 10/07/2024 CLINICAL DATA:  Hip surgery EXAM: DG HIP (WITH OR WITHOUT PELVIS) 1V RIGHT COMPARISON:  10/03/2023 FINDINGS: Multiple low resolution intraoperative spot views of the right hip. Total fluoroscopy time was 25.7 seconds, fluoroscopic dose of 1.9497 mGy. The images were obtained during operative right hip replacement. IMPRESSION: Intraoperative fluoroscopic assistance provided during right hip replacement. Electronically Signed   By: Luke Bun M.D.   On: 10/07/2024 23:37   DG C-Arm 1-60 Min-No Report Result Date: 10/07/2024 Fluoroscopy was utilized by the requesting physician.  No radiographic interpretation.   DG C-Arm 1-60 Min-No Report Result Date: 10/07/2024 Fluoroscopy was utilized by the requesting physician.  No radiographic interpretation.    Disposition: Discharge disposition: 01-Home or Self Care          Follow-up Information     Jule Ronal CROME, PA-C. Schedule an appointment as soon as possible for a visit in 2 week(s).   Specialty: Orthopedic Surgery Contact information: [address] [place]  [place] [address] [phone]                  Signed: Ronal CROME Jule 10/08/2024, 8:28 AM

## 2024-10-08 NOTE — Telephone Encounter (Signed)
 Pt called stating she had surgery yesterday and needed to know if Dr Jerri is coming to see her today and when she will be discharged and also have medical questions. Pt asking for call at [phone].

## 2024-10-08 NOTE — Progress Notes (Signed)
 Chaplain responded to [facility] consult. Chaplain introduced spiritual care and offered support in the setting of inpatient admission. Chaplain attempted to provide education on Advance Directives, but pt had trouble understanding stating, I am living well. I am living so well. And I can make these decisions for myself. Chaplain deferred to leaving the documents at the bedside for pt review independently as she was preoccupied with concerns about an allergic reaction to her prescribed medications and whether she should take them at home and how long she should wear her compression socks. Chaplain notified pt RN that the pt had these concerns.  Alan HERO. Davee Lomax, M.Div. [facility] Chaplain Pager [phone] Office [phone]      10/08/24 1032  Spiritual Encounters  Type of Visit Initial  Care provided to: Pt and family  Reason for visit Advance directives  OnCall Visit No  Advance Directives (For Healthcare)  Does Patient Have a Medical Advance Directive? No  Would patient like information on creating a medical advance directive? Yes (Inpatient - patient defers creating a medical advance directive at this time - Information given)

## 2024-10-08 NOTE — Progress Notes (Signed)
 Subjective: 1 Day Post-Op Procedures (LRB): ARTHROPLASTY, HIP, TOTAL, ANTERIOR APPROACH, RIGHT (Right) Patient reports pain as mild.    Objective: Vital signs in last 24 hours: Temp:  [97.3 F (36.3 C)-98 F (36.7 C)] 97.3 F (36.3 C) (01/13 0751) Pulse Rate:  [60-76] 68 (01/13 0751) Resp:  [10-17] 17 (01/13 0552) BP: (123-154)/(65-82) 129/65 (01/13 0751) SpO2:  [97 %-100 %] 100 % (01/13 0751) Weight:  [51.3 kg] 51.3 kg (01/12 1506)  Intake/Output from previous day: 01/12 0701 - 01/13 0700 In: 1741.8 [P.O.:360; I.V.:1381.8] Out: 575 [Urine:425; Blood:150] Intake/Output this shift: No intake/output data recorded.  No results for input(s): HGB in the last 72 hours. No results for input(s): WBC, RBC, HCT, PLT in the last 72 hours. No results for input(s): NA, K, CL, CO2, BUN, CREATININE, GLUCOSE, CALCIUM  in the last 72 hours. No results for input(s): LABPT, INR in the last 72 hours.  Neurologically intact Neurovascular intact Sensation intact distally Intact pulses distally Dorsiflexion/Plantar flexion intact Incision: dressing C/D/I No cellulitis present Compartment soft   Assessment/Plan: 1 Day Post-Op Procedures (LRB): ARTHROPLASTY, HIP, TOTAL, ANTERIOR APPROACH, RIGHT (Right) Advance diet Up with therapy D/C IV fluids D/c home with hhpt once cleared by PT WBAT RLE      Ronal LITTIE Grave 10/08/2024, 8:26 AM

## 2024-10-08 NOTE — Telephone Encounter (Signed)
 Melissa Brennan rounded on her today.  If she clears PT today she can go home without seeing me.

## 2024-10-08 NOTE — Telephone Encounter (Signed)
 I think so?  She asked about pain meds and told me oxy makes her face turn red. She tolerated norco in the hospital so I sent that to her pharmacy to take instead.  She asked if xu was coming to see her and I told her I do the rounding that he was in clinic.  Not sure what other concerns she has?

## 2024-10-08 NOTE — Care Management Obs Status (Signed)
 MEDICARE OBSERVATION STATUS NOTIFICATION   Patient Details  Name: Melissa Brennan MRN: [id number] Date of Birth: 05-Jul-1946   Medicare Observation Status Notification Given:     Obs notice signed and copy given.   Emeri Estill 10/08/2024, 11:21 AM

## 2024-10-08 NOTE — Plan of Care (Signed)
 " Problem: Education: Goal: Knowledge of General Education information will improve Description: Including pain rating scale, medication(s)/side effects and non-pharmacologic comfort measures Outcome: Adequate for Discharge   Problem: Health Behavior/Discharge Planning: Goal: Ability to manage health-related needs will improve Outcome: Adequate for Discharge   Problem: Clinical Measurements: Goal: Ability to maintain clinical measurements within normal limits will improve Outcome: Adequate for Discharge Goal: Will remain free from infection Outcome: Adequate for Discharge Goal: Diagnostic test results will improve Outcome: Adequate for Discharge Goal: Respiratory complications will improve Outcome: Adequate for Discharge Goal: Cardiovascular complication will be avoided Outcome: Adequate for Discharge   Problem: Activity: Goal: Risk for activity intolerance will decrease Outcome: Adequate for Discharge   Problem: Nutrition: Goal: Adequate nutrition will be maintained Outcome: Adequate for Discharge   Problem: Coping: Goal: Level of anxiety will decrease Outcome: Adequate for Discharge   Problem: Elimination: Goal: Will not experience complications related to bowel motility Outcome: Adequate for Discharge Goal: Will not experience complications related to urinary retention Outcome: Adequate for Discharge   Problem: Pain Managment: Goal: General experience of comfort will improve and/or be controlled Outcome: Adequate for Discharge   Problem: Safety: Goal: Ability to remain free from injury will improve Outcome: Adequate for Discharge   Problem: Skin Integrity: Goal: Risk for impaired skin integrity will decrease Outcome: Adequate for Discharge   Problem: Education: Goal: Knowledge of the prescribed therapeutic regimen will improve Outcome: Adequate for Discharge   Problem: Bowel/Gastric: Goal: Gastrointestinal status for postoperative course will  improve Outcome: Adequate for Discharge   Problem: Cardiac: Goal: Ability to maintain an adequate cardiac output Outcome: Adequate for Discharge Goal: Will show no evidence of cardiac arrhythmias Outcome: Adequate for Discharge   Problem: Nutritional: Goal: Will attain and maintain optimal nutritional status Outcome: Adequate for Discharge   Problem: Neurological: Goal: Will regain or maintain usual level of consciousness Outcome: Adequate for Discharge   Problem: Clinical Measurements: Goal: Ability to maintain clinical measurements within normal limits Outcome: Adequate for Discharge Goal: Postoperative complications will be avoided or minimized Outcome: Adequate for Discharge   Problem: Respiratory: Goal: Will regain and/or maintain adequate ventilation Outcome: Adequate for Discharge Goal: Respiratory status will improve Outcome: Adequate for Discharge   Problem: Skin Integrity: Goal: Demonstrates signs of wound healing without infection Outcome: Adequate for Discharge   Problem: Urinary Elimination: Goal: Will remain free from infection Outcome: Adequate for Discharge Goal: Ability to achieve and maintain adequate urine output Outcome: Adequate for Discharge   Problem: Education: Goal: Knowledge of the prescribed therapeutic regimen will improve Outcome: Adequate for Discharge Goal: Understanding of discharge needs will improve Outcome: Adequate for Discharge Goal: Individualized Educational Video(s) Outcome: Adequate for Discharge   Problem: Activity: Goal: Ability to avoid complications of mobility impairment will improve Outcome: Adequate for Discharge Goal: Ability to tolerate increased activity will improve Outcome: Adequate for Discharge   Problem: Clinical Measurements: Goal: Postoperative complications will be avoided or minimized Outcome: Adequate for Discharge   Problem: Pain Management: Goal: Pain level will decrease with appropriate  interventions Outcome: Adequate for Discharge   Problem: Skin Integrity: Goal: Will show signs of wound healing Outcome: Adequate for Discharge   Problem: Acute Rehab PT Goals(only PT should resolve) Goal: Pt Will Go Supine/Side To Sit Outcome: Adequate for Discharge Goal: Pt Will Go Sit To Supine/Side Outcome: Adequate for Discharge Goal: Patient Will Transfer Sit To/From Stand Outcome: Adequate for Discharge Goal: Pt Will Ambulate Outcome: Adequate for Discharge Goal: Pt Will Go Up/Down Stairs Outcome: Adequate for  Discharge Goal: Pt/caregiver will Perform Home Exercise Program Outcome: Adequate for Discharge   "

## 2024-10-10 ENCOUNTER — Ambulatory Visit: Admitting: Physician Assistant

## 2024-10-10 ENCOUNTER — Telehealth: Payer: Self-pay | Admitting: Orthopaedic Surgery

## 2024-10-10 VITALS — BP 138/74 | Temp 98.4°F | Resp 18

## 2024-10-10 DIAGNOSIS — Z96641 Presence of right artificial hip joint: Secondary | ICD-10-CM

## 2024-10-10 NOTE — Telephone Encounter (Signed)
 Patient called and left message on voicemail asking for a renewal on her handicap tag.  She says her handicap tag has expired.

## 2024-10-10 NOTE — Progress Notes (Signed)
 "  Office Visit Note   Patient: Melissa Brennan           Date of Birth: August 29, 1946           MRN: [id number] Visit Date: 10/10/2024              Requested by: Levern Hutching, MD [phone] [address] [place],  [place] [address] PCP: Levern Hutching, MD  No chief complaint on file.     HPI: Patient is a pleasant 79 year old woman who is 3 days status post right total hip arthroplasty with Dr. Jerri.  She comes in today complaining of thigh and knee swelling as well as difficulties with pain control.  She cannot take oxycodone  because it makes her face red.  She was given hydrocodone  instead and unfortunately this caused the same reaction.  She did take Benadryl  denied any shortness of breath or rash anywhere else.  So she is currently taking tramadol and Robaxin .  She cannot take anti-inflammatories because she is on blood thinners denies any specific fever or chills but she says her leg does feel warm  Assessment & Plan: Visit Diagnoses:  1. Status post total replacement of right hip     Plan: 3 days status post right total hip arthroplasty with Dr. Jerri.  I do not see any evidence of a seroma or infection she is actually ambulating quite well with her walker.  She is taking tramadol and Robaxin .  She does not like to take the tramadol a lot.  She cannot take anti-inflammatories.  I suggested taking regular doses of Tylenol  in between the other medications but not to exceed 3000 mg a day.  Can follow-up or call us  at any time.  We did take vitals today no concerns there.  Has a regular follow-up scheduled later this month.  I will course see if she has any problems in between if needed  Follow-Up Instructions: No follow-ups on file.   Ortho Exam  Patient is alert, oriented, no adenopathy, well-dressed, normal affect, normal respiratory effort. Examination of her right hip dressing is in place small amount of blood noted but does not go through her dressing.  She has no surrounding  erythema compartments are soft no fluctuance.  No evidence of a seroma.  Upper thigh has some swelling but no erythema or cellulitis once again compressible nontender.  She is able to actively flex and extend her ankle.  Distal pulses are intact.  Negative Toula' sign    Imaging: No results found. No images are attached to the encounter.  Labs: Lab Results  Component Value Date   LABURIC 2.6 (L) 08/24/2022   REPTSTATUS 11/13/2017 FINAL 11/12/2017   CULT  11/12/2017    NO GROWTH Performed at [facility], [address] [place], [place] [address]      Lab Results  Component Value Date   ALBUMIN 4.1 11/27/2017    Lab Results  Component Value Date   MG 2.0 11/27/2017   Lab Results  Component Value Date   VD25OH 75 12/31/2021    No results found for: PREALBUMIN    Latest Ref Rng & Units 10/03/2024    9:00 AM 12/31/2021    8:27 AM 06/23/2019    2:59 AM  CBC EXTENDED  WBC 4.0 - 10.5 K/uL 10.2  9.0  8.8   RBC 3.87 - 5.11 MIL/uL 4.45  4.11  4.41   Hemoglobin 12.0 - 15.0 g/dL 86.9  88.0  9.9  HCT 36.0 - 46.0 % 39.8  36.3  32.0   Platelets 150 - 400 K/uL 259  261  230   NEUT# 1,500 - 7,800 cells/uL  4,689    Lymph# 850 - 3,900 cells/uL  3,636       There is no height or weight on file to calculate BMI.  Orders:  No orders of the defined types were placed in this encounter.  No orders of the defined types were placed in this encounter.    Procedures: No procedures performed  Clinical Data: No additional findings.  ROS:  All other systems negative, except as noted in the HPI. Review of Systems  Objective: Vital Signs: BP 138/74 (BP Location: Right Arm, Patient Position: Supine, Cuff Size: Normal)   Temp 98.4 F (36.9 C) (Oral)   Resp 18   SpO2 98%   Specialty Comments:  No specialty comments available.  PMFS History: Patient Active Problem List   Diagnosis Date Noted   Status post total replacement of right hip 10/07/2024   Constipation  08/04/2022   Primary osteoarthritis of right hip 06/23/2022   Chronic GI bleeding 06/21/2019   Past Medical History:  Diagnosis Date   A-fib [facility])    Hyperlipidemia    Hypertension    Vertigo     No family history on file.  Past Surgical History:  Procedure Laterality Date   BUNIONECTOMY     april 2023 was the 2nd surgery   ENTEROSCOPY N/A 02/12/2021   Procedure: ENTEROSCOPY;  Surgeon: Rollin Dover, MD;  Location: [facility];  Service: Endoscopy;  Laterality: N/A;   ESOPHAGOGASTRODUODENOSCOPY (EGD) WITH PROPOFOL  N/A 07/26/2019   Procedure: ESOPHAGOGASTRODUODENOSCOPY (EGD) WITH PROPOFOL ;  Surgeon: Rollin Dover, MD;  Location: [facility];  Service: Endoscopy;  Laterality: N/A;   HOT HEMOSTASIS N/A 07/26/2019   Procedure: HOT HEMOSTASIS (ARGON PLASMA COAGULATION/BICAP);  Surgeon: Rollin Dover, MD;  Location: [facility];  Service: Endoscopy;  Laterality: N/A;   JOINT REPLACEMENT     TOTAL HIP ARTHROPLASTY Right 10/07/2024   Procedure: ARTHROPLASTY, HIP, TOTAL, ANTERIOR APPROACH, RIGHT;  Surgeon: Jerri Kay HERO, MD;  Location: [facility] OR;  Service: Orthopedics;  Laterality: Right;  3-C   Social History   Occupational History   Not on file  Tobacco Use   Smoking status: Never   Smokeless tobacco: Never  Vaping Use   Vaping status: Never Used  Substance and Sexual Activity   Alcohol use: No   Drug use: No   Sexual activity: Not on file       "

## 2024-10-11 NOTE — Telephone Encounter (Signed)
 Called patient. Left handicap placard up front for pick up.

## 2024-10-15 ENCOUNTER — Inpatient Hospital Stay: Payer: Self-pay | Admitting: Family Medicine

## 2024-10-22 ENCOUNTER — Telehealth: Payer: Self-pay

## 2024-10-22 ENCOUNTER — Telehealth: Payer: Self-pay | Admitting: Orthopaedic Surgery

## 2024-10-22 ENCOUNTER — Encounter: Admitting: Physician Assistant

## 2024-10-22 ENCOUNTER — Ambulatory Visit: Admitting: Orthopaedic Surgery

## 2024-10-22 DIAGNOSIS — Z96641 Presence of right artificial hip joint: Secondary | ICD-10-CM

## 2024-10-22 NOTE — Telephone Encounter (Signed)
 Patient called stating that she is having pain where her stitches are.  Would like a call back at [phone].  Please advise.  Thank you.

## 2024-10-22 NOTE — Telephone Encounter (Signed)
 Patient has an appt.to see Dr. Jerri today.

## 2024-10-22 NOTE — Progress Notes (Signed)
" ° °  Post-Op Visit Note   Patient: Melissa Brennan           Date of Birth: March 02, 1946           MRN: [id number] Visit Date: 10/22/2024 PCP: Levern Hutching, MD   Assessment & Plan:  Chief Complaint:  Chief Complaint  Patient presents with   Right Hip - Pain, Routine Post Op    DOI 04/25/2024   Visit Diagnoses:  1. Status post total replacement of right hip     Plan: Patient is 2 weeks status post right total hip arthroplasty.  She is doing well overall.  Uses a walker in public for ambulation but does not use anything at home.  Takes Tylenol  for pain.  Exam of right hip shows healed surgical incision.  Expected postoperative swelling.  No neurovascular compromise.  No drainage or signs of infection.  Sutures removed Steri-Strips applied.  Continue with home exercises and walking program.  Recheck in 4 weeks with pelvis radiographs.  Follow-Up Instructions: Return in about 4 weeks (around 11/19/2024) for with lindsey.   Orders:  No orders of the defined types were placed in this encounter.  No orders of the defined types were placed in this encounter.   Imaging: No results found.  PMFS History: Patient Active Problem List   Diagnosis Date Noted   Status post total replacement of right hip 10/07/2024   Constipation 08/04/2022   Primary osteoarthritis of right hip 06/23/2022   Chronic GI bleeding 06/21/2019   Past Medical History:  Diagnosis Date   A-fib [facility])    Hyperlipidemia    Hypertension    Vertigo     No family history on file.  Past Surgical History:  Procedure Laterality Date   BUNIONECTOMY     april 2023 was the 2nd surgery   ENTEROSCOPY N/A 02/12/2021   Procedure: ENTEROSCOPY;  Surgeon: Rollin Dover, MD;  Location: [facility];  Service: Endoscopy;  Laterality: N/A;   ESOPHAGOGASTRODUODENOSCOPY (EGD) WITH PROPOFOL  N/A 07/26/2019   Procedure: ESOPHAGOGASTRODUODENOSCOPY (EGD) WITH PROPOFOL ;  Surgeon: Rollin Dover, MD;  Location: [facility];  Service:  Endoscopy;  Laterality: N/A;   HOT HEMOSTASIS N/A 07/26/2019   Procedure: HOT HEMOSTASIS (ARGON PLASMA COAGULATION/BICAP);  Surgeon: Rollin Dover, MD;  Location: [facility];  Service: Endoscopy;  Laterality: N/A;   JOINT REPLACEMENT     TOTAL HIP ARTHROPLASTY Right 10/07/2024   Procedure: ARTHROPLASTY, HIP, TOTAL, ANTERIOR APPROACH, RIGHT;  Surgeon: Jerri Kay HERO, MD;  Location: [facility];  Service: Orthopedics;  Laterality: Right;  3-C   Social History   Occupational History   Not on file  Tobacco Use   Smoking status: Never   Smokeless tobacco: Never  Vaping Use   Vaping status: Never Used  Substance and Sexual Activity   Alcohol use: No   Drug use: No   Sexual activity: Not on file     "

## 2024-10-22 NOTE — Telephone Encounter (Signed)
 Patient called. Says she got a ride to come today. Would like a call back.

## 2024-10-29 ENCOUNTER — Encounter: Admitting: Physician Assistant

## 2024-11-19 ENCOUNTER — Encounter: Admitting: Orthopaedic Surgery
